# Patient Record
Sex: Male | Born: 1970 | Race: White | Hispanic: No | Smoking: Current every day smoker
Health system: Southern US, Community
[De-identification: ages and names within clinical notes are randomized; demographics above are authoritative.]

## PROBLEM LIST (undated history)

## (undated) DIAGNOSIS — Z720 Tobacco use: Secondary | ICD-10-CM

## (undated) DIAGNOSIS — F1090 Alcohol use, unspecified, uncomplicated: Secondary | ICD-10-CM

## (undated) DIAGNOSIS — R319 Hematuria, unspecified: Secondary | ICD-10-CM

## (undated) DIAGNOSIS — Z789 Other specified health status: Secondary | ICD-10-CM

## (undated) DIAGNOSIS — F419 Anxiety disorder, unspecified: Secondary | ICD-10-CM

## (undated) HISTORY — DX: Hematuria, unspecified: R31.9

## (undated) HISTORY — DX: Other specified health status: Z78.9

## (undated) HISTORY — DX: Tobacco use: Z72.0

## (undated) HISTORY — DX: Alcohol use, unspecified, uncomplicated: F10.90

## (undated) HISTORY — PX: SPLENECTOMY, TOTAL: SHX788

## (undated) HISTORY — DX: Anxiety disorder, unspecified: F41.9

---

## 1998-05-19 ENCOUNTER — Emergency Department (HOSPITAL_COMMUNITY): Admission: EM | Admit: 1998-05-19 | Discharge: 1998-05-19 | Payer: Self-pay | Admitting: Emergency Medicine

## 1998-05-19 ENCOUNTER — Encounter: Payer: Self-pay | Admitting: Emergency Medicine

## 1998-06-04 ENCOUNTER — Emergency Department (HOSPITAL_COMMUNITY): Admission: EM | Admit: 1998-06-04 | Discharge: 1998-06-04 | Payer: Self-pay | Admitting: Emergency Medicine

## 1998-10-03 ENCOUNTER — Emergency Department (HOSPITAL_COMMUNITY): Admission: EM | Admit: 1998-10-03 | Discharge: 1998-10-03 | Payer: Self-pay | Admitting: Emergency Medicine

## 1999-01-11 ENCOUNTER — Emergency Department (HOSPITAL_COMMUNITY): Admission: EM | Admit: 1999-01-11 | Discharge: 1999-01-11 | Payer: Self-pay | Admitting: Emergency Medicine

## 1999-01-11 ENCOUNTER — Encounter: Payer: Self-pay | Admitting: Emergency Medicine

## 2000-12-15 ENCOUNTER — Emergency Department (HOSPITAL_COMMUNITY): Admission: EM | Admit: 2000-12-15 | Discharge: 2000-12-15 | Payer: Self-pay | Admitting: Emergency Medicine

## 2004-11-14 ENCOUNTER — Emergency Department (HOSPITAL_COMMUNITY): Admission: EM | Admit: 2004-11-14 | Discharge: 2004-11-14 | Payer: Self-pay | Admitting: Emergency Medicine

## 2005-05-12 ENCOUNTER — Emergency Department (HOSPITAL_COMMUNITY): Admission: EM | Admit: 2005-05-12 | Discharge: 2005-05-13 | Payer: Self-pay | Admitting: Emergency Medicine

## 2005-05-20 ENCOUNTER — Emergency Department (HOSPITAL_COMMUNITY): Admission: EM | Admit: 2005-05-20 | Discharge: 2005-05-20 | Payer: Self-pay | Admitting: Family Medicine

## 2006-10-08 ENCOUNTER — Emergency Department: Payer: Self-pay | Admitting: Emergency Medicine

## 2006-11-16 ENCOUNTER — Emergency Department: Payer: Self-pay | Admitting: Emergency Medicine

## 2008-12-19 ENCOUNTER — Emergency Department: Payer: Self-pay | Admitting: Emergency Medicine

## 2009-12-17 ENCOUNTER — Emergency Department: Payer: Self-pay | Admitting: Unknown Physician Specialty

## 2010-03-14 ENCOUNTER — Emergency Department: Payer: Self-pay | Admitting: Emergency Medicine

## 2010-03-15 ENCOUNTER — Emergency Department: Payer: Self-pay | Admitting: Emergency Medicine

## 2010-11-29 ENCOUNTER — Emergency Department (HOSPITAL_COMMUNITY)
Admission: EM | Admit: 2010-11-29 | Discharge: 2010-11-29 | Disposition: A | Discharge status: Home / Self Care | Payer: Self-pay | Attending: Emergency Medicine | Admitting: Emergency Medicine

## 2010-11-29 DIAGNOSIS — X500XXA Overexertion from strenuous movement or load, initial encounter: Secondary | ICD-10-CM | POA: Insufficient documentation

## 2010-11-29 DIAGNOSIS — Y92009 Unspecified place in unspecified non-institutional (private) residence as the place of occurrence of the external cause: Secondary | ICD-10-CM | POA: Insufficient documentation

## 2010-11-29 DIAGNOSIS — S335XXA Sprain of ligaments of lumbar spine, initial encounter: Secondary | ICD-10-CM | POA: Insufficient documentation

## 2011-11-28 ENCOUNTER — Emergency Department (HOSPITAL_COMMUNITY)
Admission: EM | Admit: 2011-11-28 | Discharge: 2011-11-28 | Disposition: A | Discharge status: Home / Self Care | Payer: Self-pay | Attending: Emergency Medicine | Admitting: Emergency Medicine

## 2011-11-28 ENCOUNTER — Encounter (HOSPITAL_COMMUNITY): Payer: Self-pay | Admitting: *Deleted

## 2011-11-28 DIAGNOSIS — R51 Headache: Secondary | ICD-10-CM | POA: Insufficient documentation

## 2011-11-28 DIAGNOSIS — S0180XA Unspecified open wound of other part of head, initial encounter: Secondary | ICD-10-CM | POA: Insufficient documentation

## 2011-11-28 DIAGNOSIS — S0181XA Laceration without foreign body of other part of head, initial encounter: Secondary | ICD-10-CM

## 2011-11-28 DIAGNOSIS — W2209XA Striking against other stationary object, initial encounter: Secondary | ICD-10-CM | POA: Insufficient documentation

## 2011-11-28 MED ORDER — TETANUS-DIPHTH-ACELL PERTUSSIS 5-2.5-18.5 LF-MCG/0.5 IM SUSP
0.5000 mL | Freq: Once | INTRAMUSCULAR | Status: AC
  Administered 2011-11-28: 0.5 mL via INTRAMUSCULAR
  Filled 2011-11-28: qty 0.5

## 2011-11-28 MED ORDER — ACETAMINOPHEN 325 MG PO TABS
650.0000 mg | ORAL_TABLET | Freq: Once | ORAL | Status: AC
  Administered 2011-11-28: 650 mg via ORAL
  Filled 2011-11-28: qty 2

## 2011-11-28 MED ORDER — LIDOCAINE HCL 1 % IJ SOLN
INTRAMUSCULAR | Status: AC
  Filled 2011-11-28: qty 20

## 2011-11-28 NOTE — Discharge Instructions (Signed)

## 2011-11-28 NOTE — ED Provider Notes (Signed)
History     CSN: [id number]  Arrival date & time 11/28/11  1632   First MD Initiated Contact with Patient 11/28/11 1910      Chief Complaint  Patient presents with  . 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  Patient is a 41 y.o. male presenting with skin laceration. The history is provided by the patient. No language interpreter was used.  Laceration  The incident occurred 3 to 5 hours ago. The laceration is located on the face. The laceration is 1 cm in size. The laceration mechanism was a a metal edge. The pain is at a severity of 5/10. The pain is moderate. The pain has been constant since onset. He reports no foreign bodies present. His tetanus status is out of date.    History reviewed. No pertinent past medical history.  History reviewed. No pertinent past surgical history.  No family history on file.  History  Substance Use Topics  . Smoking status: Current Everyday Smoker -- 1.0 packs/day  . Smokeless tobacco: Not on file  . Alcohol Use: No      Review of Systems  Skin: Positive for wound.  Neurological: Positive for headaches.  All other systems reviewed and are negative.    Allergies  Review of patient's allergies indicates no known allergies.  Home Medications  No current outpatient prescriptions on file.  BP 140/93  Pulse 86  Temp(Src) 98.1 F (36.7 C) (Oral)  Resp 18  Wt 146 lb (66.225 kg)  SpO2 97%  Physical Exam  Nursing note and vitals reviewed. Constitutional: He is oriented to person, place, and time. He appears well-developed and well-nourished. No distress.  HENT:  Head: Normocephalic.  Right Ear: External ear  normal.  Left Ear: External ear normal.  Nose: Nose normal.  Mouth/Throat: Oropharynx is clear and moist. No oropharyngeal exudate.       1 cm laceration to left chin - hemostatic  Eyes: Conjunctivae are normal. Pupils are equal, round, and reactive to light.  Neck: Normal range of motion. Neck supple.  Cardiovascular: Normal rate, regular rhythm and normal heart sounds.  Exam reveals no gallop and no friction rub.   No murmur heard. Pulmonary/Chest: Effort normal and breath sounds normal. No respiratory distress. He has no wheezes. He has no rales. He exhibits no tenderness.  Abdominal: Soft. Bowel sounds are normal. He exhibits no distension. There is no tenderness.  Musculoskeletal: Normal range of motion. He exhibits no edema and no tenderness.  Lymphadenopathy:    He has no cervical adenopathy.  Neurological: He is alert and oriented to person, place, and time. No cranial nerve deficit.  Skin: Skin is warm and dry. No rash noted. No erythema. No pallor.  Psychiatric: He has a normal mood and affect. His behavior is normal. Judgment and thought content normal.    ED Course  Procedures (including critical care time)  Labs Reviewed - No data to display No results found.  LACERATION REPAIR Performed by: Patrecia Pour. Authorized by: Patrecia Pour Consent: Verbal consent obtained. Risks and benefits: risks, benefits and alternatives were discussed Consent given by: patient Patient identity confirmed: provided demographic data Prepped and Draped in normal sterile fashion Wound explored  Laceration Location: left chin  Laceration Length: 1cm  No Foreign Bodies seen or palpated  Anesthesia: local infiltration  Local anesthetic: lidocaine 1% without epinephrine  Anesthetic total: 2 ml  Irrigation method: syringe Amount of cleaning: standard  Skin closure: 4.0 prolene  Number of sutures: 2  Technique: simple interrupted  Patient tolerance: Patient tolerated  the procedure well with no immediate complications.  Chin laceration    MDM  Patient here with left chin laceration without LOC - simple laceration repair - patient tolerated well - tylenol given for headache.        Izola [place], [place] 11/28/11 2001

## 2011-11-28 NOTE — ED Provider Notes (Signed)
Medical screening examination/treatment/procedure(s) were performed by non-physician practitioner and as supervising physician I was immediately available for consultation/collaboration.  Yassen Kinnett, MD 11/28/11 2037 

## 2011-11-28 NOTE — ED Notes (Signed)
Bleeding controlled @ present
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
## 2017-07-05 ENCOUNTER — Encounter: Payer: Self-pay | Admitting: Emergency Medicine

## 2017-07-05 ENCOUNTER — Emergency Department
Admission: EM | Admit: 2017-07-05 | Discharge: 2017-07-05 | Disposition: A | Discharge status: Home / Self Care | Payer: Self-pay | Attending: Emergency Medicine | Admitting: Emergency Medicine

## 2017-07-05 ENCOUNTER — Other Ambulatory Visit: Payer: Self-pay

## 2017-07-05 ENCOUNTER — Emergency Department: Payer: Self-pay

## 2017-07-05 DIAGNOSIS — W208XXA Other cause of strike by thrown, projected or falling object, initial encounter: Secondary | ICD-10-CM | POA: Insufficient documentation

## 2017-07-05 DIAGNOSIS — M79601 Pain in right arm: Secondary | ICD-10-CM

## 2017-07-05 DIAGNOSIS — Y999 Unspecified external cause status: Secondary | ICD-10-CM | POA: Insufficient documentation

## 2017-07-05 DIAGNOSIS — Y929 Unspecified place or not applicable: Secondary | ICD-10-CM | POA: Insufficient documentation

## 2017-07-05 DIAGNOSIS — F17210 Nicotine dependence, cigarettes, uncomplicated: Secondary | ICD-10-CM | POA: Insufficient documentation

## 2017-07-05 DIAGNOSIS — Y9389 Activity, other specified: Secondary | ICD-10-CM | POA: Insufficient documentation

## 2017-07-05 DIAGNOSIS — S42464A Nondisplaced fracture of medial condyle of right humerus, initial encounter for closed fracture: Secondary | ICD-10-CM | POA: Insufficient documentation

## 2017-07-05 MED ORDER — OXYCODONE-ACETAMINOPHEN 5-325 MG PO TABS
1.0000 | ORAL_TABLET | Freq: Once | ORAL | Status: AC
  Administered 2017-07-05: 1 via ORAL
  Filled 2017-07-05: qty 1

## 2017-07-05 MED ORDER — OXYCODONE-ACETAMINOPHEN 5-325 MG PO TABS: 1.0000 | ORAL_TABLET | ORAL | 0 refills | Status: AC | PRN

## 2017-07-05 MED ORDER — MELOXICAM 15 MG PO TABS: 15.0000 mg | ORAL_TABLET | Freq: Every day | ORAL | 1 refills | Status: DC
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
## 2017-07-05 NOTE — ED Notes (Signed)
PT in XR, ICE PACK PLACED IN ROOM TO USE ON ARRIVAL BACK TO ROOM
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
## 2017-10-31 ENCOUNTER — Other Ambulatory Visit: Payer: Self-pay

## 2017-10-31 ENCOUNTER — Emergency Department
Admission: EM | Admit: 2017-10-31 | Discharge: 2017-10-31 | Disposition: A | Discharge status: Home / Self Care | Payer: No Typology Code available for payment source | Attending: Emergency Medicine | Admitting: Emergency Medicine

## 2017-10-31 ENCOUNTER — Emergency Department: Payer: No Typology Code available for payment source

## 2017-10-31 DIAGNOSIS — S42122A Displaced fracture of acromial process, left shoulder, initial encounter for closed fracture: Secondary | ICD-10-CM | POA: Diagnosis not present

## 2017-10-31 DIAGNOSIS — S4992XA Unspecified injury of left shoulder and upper arm, initial encounter: Secondary | ICD-10-CM | POA: Diagnosis present

## 2017-10-31 DIAGNOSIS — F17200 Nicotine dependence, unspecified, uncomplicated: Secondary | ICD-10-CM | POA: Diagnosis not present

## 2017-10-31 DIAGNOSIS — Y929 Unspecified place or not applicable: Secondary | ICD-10-CM | POA: Insufficient documentation

## 2017-10-31 DIAGNOSIS — Y999 Unspecified external cause status: Secondary | ICD-10-CM | POA: Insufficient documentation

## 2017-10-31 DIAGNOSIS — Y939 Activity, unspecified: Secondary | ICD-10-CM | POA: Insufficient documentation

## 2017-10-31 MED ORDER — OXYCODONE-ACETAMINOPHEN 5-325 MG PO TABS
1.0000 | ORAL_TABLET | Freq: Once | ORAL | Status: AC
  Administered 2017-10-31: 1 via ORAL
  Filled 2017-10-31: qty 1

## 2017-10-31 MED ORDER — MELOXICAM 15 MG PO TABS: 15.0000 mg | ORAL_TABLET | Freq: Every day | ORAL | 2 refills | Status: DC

## 2017-10-31 MED ORDER — OXYCODONE-ACETAMINOPHEN 5-325 MG PO TABS: 1.0000 | ORAL_TABLET | ORAL | 0 refills | Status: DC | PRN
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
## 2017-10-31 NOTE — Discharge Instructions (Signed)
Follow-up with Dr. Harlow Mares.  Call for an appointment.  Take medication as prescribed.  Apply ice.  Wear the sling for comfort.  You will need to still take your arm out and move it around so you will not get a frozen shoulder.  Return to emergency department if worsening
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
## 2018-02-22 ENCOUNTER — Encounter: Payer: Self-pay | Admitting: *Deleted

## 2018-02-22 ENCOUNTER — Emergency Department
Admission: EM | Admit: 2018-02-22 | Discharge: 2018-02-22 | Disposition: A | Discharge status: Home / Self Care | Payer: Self-pay | Attending: Emergency Medicine | Admitting: Emergency Medicine

## 2018-02-22 ENCOUNTER — Other Ambulatory Visit: Payer: Self-pay

## 2018-02-22 ENCOUNTER — Emergency Department: Payer: Self-pay

## 2018-02-22 DIAGNOSIS — Z79899 Other long term (current) drug therapy: Secondary | ICD-10-CM | POA: Insufficient documentation

## 2018-02-22 DIAGNOSIS — S60229A Contusion of unspecified hand, initial encounter: Secondary | ICD-10-CM

## 2018-02-22 DIAGNOSIS — F17210 Nicotine dependence, cigarettes, uncomplicated: Secondary | ICD-10-CM | POA: Insufficient documentation

## 2018-02-22 DIAGNOSIS — Y929 Unspecified place or not applicable: Secondary | ICD-10-CM | POA: Insufficient documentation

## 2018-02-22 DIAGNOSIS — T07XXXA Unspecified multiple injuries, initial encounter: Secondary | ICD-10-CM

## 2018-02-22 DIAGNOSIS — Y999 Unspecified external cause status: Secondary | ICD-10-CM | POA: Insufficient documentation

## 2018-02-22 DIAGNOSIS — Z23 Encounter for immunization: Secondary | ICD-10-CM | POA: Insufficient documentation

## 2018-02-22 DIAGNOSIS — S60511A Abrasion of right hand, initial encounter: Secondary | ICD-10-CM | POA: Insufficient documentation

## 2018-02-22 DIAGNOSIS — S300XXA Contusion of lower back and pelvis, initial encounter: Secondary | ICD-10-CM | POA: Insufficient documentation

## 2018-02-22 DIAGNOSIS — Y939 Activity, unspecified: Secondary | ICD-10-CM | POA: Insufficient documentation

## 2018-02-22 DIAGNOSIS — M25532 Pain in left wrist: Secondary | ICD-10-CM | POA: Insufficient documentation

## 2018-02-22 MED ORDER — OXYCODONE-ACETAMINOPHEN 5-325 MG PO TABS
1.0000 | ORAL_TABLET | Freq: Once | ORAL | Status: AC
  Administered 2018-02-22: 1 via ORAL
  Filled 2018-02-22: qty 1

## 2018-02-22 MED ORDER — IBUPROFEN 600 MG PO TABS: 600.0000 mg | ORAL_TABLET | Freq: Four times a day (QID) | ORAL | 0 refills | Status: DC | PRN

## 2018-02-22 MED ORDER — BACITRACIN ZINC 500 UNIT/GM EX OINT: TOPICAL_OINTMENT | Freq: Once | CUTANEOUS | Status: DC

## 2018-02-22 MED ORDER — TETANUS-DIPHTH-ACELL PERTUSSIS 5-2.5-18.5 LF-MCG/0.5 IM SUSP
0.5000 mL | Freq: Once | INTRAMUSCULAR | Status: AC
  Administered 2018-02-22: 0.5 mL via INTRAMUSCULAR
  Filled 2018-02-22: qty 0.5

## 2018-02-22 MED ORDER — HYDROCODONE-ACETAMINOPHEN 5-325 MG PO TABS: 1.0000 | ORAL_TABLET | Freq: Four times a day (QID) | ORAL | 0 refills | Status: AC | PRN

## 2018-02-22 NOTE — ED Notes (Signed)
Patient's abrasions were dressed with sterile gauze and wrapped with kling. Patient tolerated procedure well. Patient was given instructions to clean and medicate and dress wounds.

## 2018-02-22 NOTE — ED Notes (Signed)
Patient taken to imaging. 
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
## 2018-02-22 NOTE — Discharge Instructions (Signed)
Apply bacitracin or Neosporin ointment to the abrasions twice a day.  Return to the ER for new or worsening pain or other new or worsening symptoms that concern he.
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
## 2018-09-30 ENCOUNTER — Encounter: Payer: Self-pay | Admitting: Emergency Medicine

## 2018-09-30 ENCOUNTER — Other Ambulatory Visit: Payer: Self-pay

## 2018-09-30 ENCOUNTER — Emergency Department
Admission: EM | Admit: 2018-09-30 | Discharge: 2018-09-30 | Disposition: A | Discharge status: Home / Self Care | Payer: Self-pay | Attending: Emergency Medicine | Admitting: Emergency Medicine

## 2018-09-30 ENCOUNTER — Emergency Department: Payer: Self-pay

## 2018-09-30 DIAGNOSIS — F17210 Nicotine dependence, cigarettes, uncomplicated: Secondary | ICD-10-CM | POA: Insufficient documentation

## 2018-09-30 DIAGNOSIS — J101 Influenza due to other identified influenza virus with other respiratory manifestations: Secondary | ICD-10-CM | POA: Insufficient documentation

## 2018-09-30 DIAGNOSIS — J111 Influenza due to unidentified influenza virus with other respiratory manifestations: Secondary | ICD-10-CM

## 2018-09-30 MED ORDER — ONDANSETRON 4 MG PO TBDP
4.0000 mg | ORAL_TABLET | Freq: Once | ORAL | Status: AC
  Administered 2018-09-30: 4 mg via ORAL
  Filled 2018-09-30: qty 1

## 2018-09-30 MED ORDER — ONDANSETRON 4 MG PO TBDP: 4.0000 mg | ORAL_TABLET | Freq: Three times a day (TID) | ORAL | 0 refills | Status: DC | PRN

## 2018-09-30 NOTE — Discharge Instructions (Signed)
Follow-up with your regular doctor if not better in 3 days.  Return emergency department worsening.  Drink plenty of fluids.  Use the Zofran ODT for nausea as needed.

## 2018-09-30 NOTE — ED Provider Notes (Signed)
[facility] Emergency Department Provider Note  ____________________________________________   First MD Initiated Contact with Patient 09/30/18 0809     (approximate)  I have reviewed the triage vital signs and the nursing notes.   HISTORY  Chief Complaint Fever and Sore Throat    HPI Paul Gregory is a 48 y.o. male since emergency department with flulike symptoms, patient is complained of fever, chills, body aches.  cough, sore throat, denies vomiting, denies diarrhea; denies chest pain or sob.  Sx for 2 days, patient is afraid he has pneumonia   History reviewed. No pertinent past medical history.  There are no active problems to display for this patient.   History reviewed. No pertinent surgical history.  Prior to Admission medications   Medication Sig Start Date End Date Taking? Authorizing Provider  ondansetron (ZOFRAN-ODT) 4 MG disintegrating tablet Take 1 tablet (4 mg total) by mouth every 8 (eight) hours as needed. 09/30/18   Versie Starks, PA-C    Allergies Patient has no known allergies.  No family history on file.  Social History Social History   Tobacco Use  . Smoking status: Current Every Day Smoker    Packs/day: 1.00  . Smokeless tobacco: Never Used  Substance Use Topics  . Alcohol use: Yes    Comment: weekends  . Drug use: No    Review of Systems  Constitutional: Positive fever/chills Eyes: No visual changes. ENT: Positive sore throat. Respiratory: Positive cough Genitourinary: Negative for dysuria. Musculoskeletal: Negative for back pain. Skin: Negative for rash.    ____________________________________________   PHYSICAL EXAM:  VITAL SIGNS: ED Triage Vitals  Enc Vitals Group     BP 09/30/18 0807 132/81     Pulse Rate 09/30/18 0807 (!) 123     Resp 09/30/18 0807 18     Temp 09/30/18 0807 99.8 F (37.7 C)     Temp Source 09/30/18 0807 Oral     SpO2 09/30/18 0807 96 %     Weight 09/30/18 0804 165 lb  (74.8 kg)     Height 09/30/18 0804 5\' 7"  (1.702 m)     Head Circumference --      Peak Flow --      Pain Score 09/30/18 0804 10     Pain Loc --      Pain Edu? --      Excl. in [place]? --     Constitutional: Alert and oriented. Well appearing and in no acute distress. Eyes: Conjunctivae are normal.  Head: Atraumatic. Nose: No congestion/rhinnorhea. Mouth/Throat: Mucous membranes are moist.  Throat appears normal Neck:  supple no lymphadenopathy noted Cardiovascular: Normal rate, regular rhythm. Heart sounds are normal Respiratory: Normal respiratory effort.  No retractions, lungs c t a  GU: deferred Musculoskeletal: FROM all extremities, warm and well perfused Neurologic:  Normal speech and language.  Skin:  Skin is warm, dry and intact. No rash noted. Psychiatric: Mood and affect are normal. Speech and behavior are normal.  ____________________________________________   LABS (all labs ordered are listed, but only abnormal results are displayed)  Labs Reviewed - No data to display ____________________________________________   ____________________________________________  RADIOLOGY  Chest x-ray is negative for pneumonia  ____________________________________________   PROCEDURES  Procedure(s) performed: No  Procedures    ____________________________________________   INITIAL IMPRESSION / ASSESSMENT AND PLAN / ED COURSE  Pertinent labs & imaging results that were available during my care of the patient were reviewed by me and considered in my medical decision making (  see chart for details).   Patient is 48 year old male presents emergency department flulike symptoms.  He states he is also afraid he has pneumonia.  Physical exam shows a nontoxic male.  Chest x-ray is negative  Explained all the findings to the patient.  He was given a prescription for Zofran due to the nausea.  He is to drink plenty of fluids.  Take Tylenol and ibuprofen for pain as needed.   Return emergency department worsening.  See a regular doctor and have a repeat chest x-ray about 6 weeks due to the questionable pulmonary nodule versus nipple shadow.  He states he understands will comply.  Was discharged stable condition.     As part of my medical decision making, I reviewed the following data within the [place] History obtained from family, Nursing notes reviewed and incorporated, Old chart reviewed, Radiograph reviewed chest x-ray is negative, Notes from prior ED visits and [place] Controlled Substance Database  ____________________________________________   FINAL CLINICAL IMPRESSION(S) / ED DIAGNOSES  Final diagnoses:  Influenza      NEW MEDICATIONS STARTED DURING THIS VISIT:  Discharge Medication List as of 09/30/2018  9:04 AM    START taking these medications   Details  ondansetron (ZOFRAN-ODT) 4 MG disintegrating tablet Take 1 tablet (4 mg total) by mouth every 8 (eight) hours as needed., Starting Tue 09/30/2018, Normal         Note:  This document was prepared using Dragon voice recognition software and may include unintentional dictation errors.    Versie Starks, PA-C 09/30/18 7782    Eula Listen, MD 09/30/18 1200

## 2018-09-30 NOTE — ED Notes (Signed)
See triage note  Presents with sore throat and fever for the past couple of days   Low grade fever on arrival  Occasional cough  Non productive

## 2018-09-30 NOTE — ED Triage Notes (Signed)
Pt states fever the last few days, sore throat, cough causing his back to ache, NAD at this time.

## 2019-08-04 ENCOUNTER — Inpatient Hospital Stay (HOSPITAL_COMMUNITY)
Admission: EM | Admit: 2019-08-04 | Discharge: 2019-08-09 | DRG: 957 | Disposition: A | Discharge status: Home / Self Care | Payer: Medicaid Other | Attending: General Surgery | Admitting: General Surgery

## 2019-08-04 ENCOUNTER — Other Ambulatory Visit: Payer: Self-pay

## 2019-08-04 ENCOUNTER — Inpatient Hospital Stay (HOSPITAL_COMMUNITY): Payer: Medicaid Other

## 2019-08-04 ENCOUNTER — Encounter (HOSPITAL_COMMUNITY): Payer: Self-pay | Admitting: Emergency Medicine

## 2019-08-04 ENCOUNTER — Emergency Department (HOSPITAL_COMMUNITY): Payer: Medicaid Other

## 2019-08-04 ENCOUNTER — Emergency Department (HOSPITAL_COMMUNITY): Payer: Medicaid Other | Admitting: Anesthesiology

## 2019-08-04 ENCOUNTER — Encounter (HOSPITAL_COMMUNITY): Admission: EM | Disposition: A | Discharge status: Home / Self Care | Payer: Self-pay | Source: Home / Self Care

## 2019-08-04 DIAGNOSIS — Z23 Encounter for immunization: Secondary | ICD-10-CM | POA: Diagnosis not present

## 2019-08-04 DIAGNOSIS — Z20828 Contact with and (suspected) exposure to other viral communicable diseases: Secondary | ICD-10-CM | POA: Diagnosis present

## 2019-08-04 DIAGNOSIS — C49A3 Gastrointestinal stromal tumor of small intestine: Secondary | ICD-10-CM | POA: Diagnosis not present

## 2019-08-04 DIAGNOSIS — S46002A Unspecified injury of muscle(s) and tendon(s) of the rotator cuff of left shoulder, initial encounter: Secondary | ICD-10-CM | POA: Diagnosis present

## 2019-08-04 DIAGNOSIS — I10 Essential (primary) hypertension: Secondary | ICD-10-CM | POA: Diagnosis present

## 2019-08-04 DIAGNOSIS — S36039A Unspecified laceration of spleen, initial encounter: Secondary | ICD-10-CM | POA: Diagnosis present

## 2019-08-04 DIAGNOSIS — F1010 Alcohol abuse, uncomplicated: Secondary | ICD-10-CM | POA: Diagnosis present

## 2019-08-04 DIAGNOSIS — R578 Other shock: Secondary | ICD-10-CM | POA: Diagnosis present

## 2019-08-04 DIAGNOSIS — K661 Hemoperitoneum: Secondary | ICD-10-CM | POA: Diagnosis present

## 2019-08-04 DIAGNOSIS — D62 Acute posthemorrhagic anemia: Secondary | ICD-10-CM | POA: Diagnosis present

## 2019-08-04 DIAGNOSIS — S82461B Displaced segmental fracture of shaft of right fibula, initial encounter for open fracture type I or II: Principal | ICD-10-CM | POA: Diagnosis present

## 2019-08-04 DIAGNOSIS — D696 Thrombocytopenia, unspecified: Secondary | ICD-10-CM | POA: Diagnosis present

## 2019-08-04 DIAGNOSIS — S51811A Laceration without foreign body of right forearm, initial encounter: Secondary | ICD-10-CM | POA: Diagnosis present

## 2019-08-04 DIAGNOSIS — S0081XA Abrasion of other part of head, initial encounter: Secondary | ICD-10-CM | POA: Diagnosis present

## 2019-08-04 DIAGNOSIS — F17210 Nicotine dependence, cigarettes, uncomplicated: Secondary | ICD-10-CM | POA: Diagnosis present

## 2019-08-04 DIAGNOSIS — Z419 Encounter for procedure for purposes other than remedying health state, unspecified: Secondary | ICD-10-CM

## 2019-08-04 DIAGNOSIS — T148XXA Other injury of unspecified body region, initial encounter: Secondary | ICD-10-CM

## 2019-08-04 DIAGNOSIS — S82261B Displaced segmental fracture of shaft of right tibia, initial encounter for open fracture type I or II: Secondary | ICD-10-CM

## 2019-08-04 DIAGNOSIS — Z9889 Other specified postprocedural states: Secondary | ICD-10-CM

## 2019-08-04 DIAGNOSIS — Z978 Presence of other specified devices: Secondary | ICD-10-CM

## 2019-08-04 DIAGNOSIS — S82401B Unspecified fracture of shaft of right fibula, initial encounter for open fracture type I or II: Secondary | ICD-10-CM | POA: Diagnosis present

## 2019-08-04 DIAGNOSIS — S82202B Unspecified fracture of shaft of left tibia, initial encounter for open fracture type I or II: Secondary | ICD-10-CM

## 2019-08-04 DIAGNOSIS — T1490XA Injury, unspecified, initial encounter: Secondary | ICD-10-CM

## 2019-08-04 DIAGNOSIS — K567 Ileus, unspecified: Secondary | ICD-10-CM | POA: Diagnosis not present

## 2019-08-04 DIAGNOSIS — Z4659 Encounter for fitting and adjustment of other gastrointestinal appliance and device: Secondary | ICD-10-CM

## 2019-08-04 HISTORY — PX: LAPAROTOMY: SHX154

## 2019-08-04 LAB — CBC
HCT: 44.4 % (ref 39.0–52.0)
Hemoglobin: 15.2 g/dL (ref 13.0–17.0)
MCH: 34.5 pg — ABNORMAL HIGH (ref 26.0–34.0)
MCHC: 34.2 g/dL (ref 30.0–36.0)
MCV: 100.7 fL — ABNORMAL HIGH (ref 80.0–100.0)
Platelets: 195 10*3/uL (ref 150–400)
RBC: 4.41 MIL/uL (ref 4.22–5.81)
RDW: 11.9 % (ref 11.5–15.5)
WBC: 9.7 10*3/uL (ref 4.0–10.5)
nRBC: 0 % (ref 0.0–0.2)

## 2019-08-04 LAB — I-STAT CHEM 8, ED
BUN: 11 mg/dL (ref 6–20)
Calcium, Ion: 1 mmol/L — ABNORMAL LOW (ref 1.15–1.40)
Chloride: 106 mmol/L (ref 98–111)
Creatinine, Ser: 1.2 mg/dL (ref 0.61–1.24)
Glucose, Bld: 127 mg/dL — ABNORMAL HIGH (ref 70–99)
HCT: 43 % (ref 39.0–52.0)
Hemoglobin: 14.6 g/dL (ref 13.0–17.0)
Potassium: 3 mmol/L — ABNORMAL LOW (ref 3.5–5.1)
Sodium: 138 mmol/L (ref 135–145)
TCO2: 20 mmol/L — ABNORMAL LOW (ref 22–32)

## 2019-08-04 LAB — COMPREHENSIVE METABOLIC PANEL
ALT: 43 U/L (ref 0–44)
AST: 47 U/L — ABNORMAL HIGH (ref 15–41)
Albumin: 3.8 g/dL (ref 3.5–5.0)
Alkaline Phosphatase: 43 U/L (ref 38–126)
Anion gap: 12 (ref 5–15)
BUN: 11 mg/dL (ref 6–20)
CO2: 21 mmol/L — ABNORMAL LOW (ref 22–32)
Calcium: 8.7 mg/dL — ABNORMAL LOW (ref 8.9–10.3)
Chloride: 106 mmol/L (ref 98–111)
Creatinine, Ser: 1.09 mg/dL (ref 0.61–1.24)
GFR calc Af Amer: >60 mL/min (ref >60)
GFR calc non Af Amer: >60 mL/min (ref >60)
Glucose, Bld: 135 mg/dL — ABNORMAL HIGH (ref 70–99)
Potassium: 3.1 mmol/L — ABNORMAL LOW (ref 3.5–5.1)
Sodium: 139 mmol/L (ref 135–145)
Total Bilirubin: 0.2 mg/dL — ABNORMAL LOW (ref 0.3–1.2)
Total Protein: 6 g/dL — ABNORMAL LOW (ref 6.5–8.1)

## 2019-08-04 LAB — PROTIME-INR
INR: 0.9 (ref 0.8–1.2)
INR: 1.1 (ref 0.8–1.2)
Prothrombin Time: 12.5 seconds (ref 11.4–15.2)
Prothrombin Time: 14.4 seconds (ref 11.4–15.2)

## 2019-08-04 LAB — LACTIC ACID, PLASMA
Lactic Acid, Venous: 3.1 mmol/L (ref 0.5–1.9)
Lactic Acid, Venous: 2.1 mmol/L (ref 0.5–1.9)

## 2019-08-04 LAB — RESPIRATORY PANEL BY RT PCR (FLU A&B, COVID)
Influenza A by PCR: NEGATIVE
Influenza B by PCR: NEGATIVE
SARS Coronavirus 2 by RT PCR: NEGATIVE

## 2019-08-04 LAB — MRSA PCR SCREENING: MRSA by PCR: NEGATIVE

## 2019-08-04 LAB — APTT: aPTT: 22 seconds — ABNORMAL LOW (ref 24–36)

## 2019-08-04 LAB — ABO/RH: ABO/RH(D): O NEG

## 2019-08-04 LAB — ETHANOL: Alcohol, Ethyl (B): 287 mg/dL — ABNORMAL HIGH (ref <10)

## 2019-08-04 LAB — CDS SEROLOGY

## 2019-08-04 LAB — SAMPLE TO BLOOD BANK

## 2019-08-04 SURGERY — LAPAROTOMY, EXPLORATORY
Anesthesia: General | Site: Abdomen

## 2019-08-04 MED ORDER — SODIUM BICARBONATE 8.4 % IV SOLN
INTRAVENOUS | Status: DC | PRN
  Administered 2019-08-04: 50 meq via INTRAVENOUS

## 2019-08-04 MED ORDER — DEXAMETHASONE SODIUM PHOSPHATE 10 MG/ML IJ SOLN
INTRAMUSCULAR | Status: DC | PRN
  Administered 2019-08-04: 10 mg via INTRAVENOUS

## 2019-08-04 MED ORDER — HYDROMORPHONE HCL 1 MG/ML IJ SOLN
INTRAMUSCULAR | Status: AC
  Administered 2019-08-04: 0.5 mg via INTRAVENOUS
  Filled 2019-08-04: qty 1

## 2019-08-04 MED ORDER — ONDANSETRON HCL 4 MG/2ML IJ SOLN
INTRAMUSCULAR | Status: AC
  Filled 2019-08-04: qty 2

## 2019-08-04 MED ORDER — DOCUSATE SODIUM 100 MG PO CAPS
100.0000 mg | ORAL_CAPSULE | Freq: Two times a day (BID) | ORAL | Status: DC
  Administered 2019-08-06 – 2019-08-08 (×7): 100 mg via ORAL
  Filled 2019-08-04 (×7): qty 1

## 2019-08-04 MED ORDER — SODIUM CHLORIDE 0.9 % IV SOLN: INTRAVENOUS | Status: DC

## 2019-08-04 MED ORDER — 0.9 % SODIUM CHLORIDE (POUR BTL) OPTIME
TOPICAL | Status: DC | PRN
  Administered 2019-08-04 (×2): 2000 mL

## 2019-08-04 MED ORDER — LACTATED RINGERS IV SOLN: INTRAVENOUS | Status: DC | PRN

## 2019-08-04 MED ORDER — SODIUM CHLORIDE 0.9 % IV SOLN: INTRAVENOUS | Status: DC | PRN

## 2019-08-04 MED ORDER — EPHEDRINE 5 MG/ML INJ
INTRAVENOUS | Status: AC
  Filled 2019-08-04: qty 10

## 2019-08-04 MED ORDER — METHOCARBAMOL 1000 MG/10ML IJ SOLN
500.0000 mg | Freq: Four times a day (QID) | INTRAVENOUS | Status: DC | PRN
  Administered 2019-08-05: 500 mg via INTRAVENOUS
  Filled 2019-08-04 (×3): qty 5

## 2019-08-04 MED ORDER — LIDOCAINE HCL (CARDIAC) PF 100 MG/5ML IV SOSY
PREFILLED_SYRINGE | INTRAVENOUS | Status: DC | PRN
  Administered 2019-08-04: 80 mg via INTRATRACHEAL

## 2019-08-04 MED ORDER — SUCCINYLCHOLINE 20MG/ML (10ML) SYRINGE FOR MEDFUSION PUMP - OPTIME
INTRAMUSCULAR | Status: DC | PRN
  Administered 2019-08-04: 100 mg via INTRAVENOUS

## 2019-08-04 MED ORDER — BISACODYL 10 MG RE SUPP: 10.0000 mg | Freq: Every day | RECTAL | Status: DC | PRN

## 2019-08-04 MED ORDER — HYDROMORPHONE HCL 1 MG/ML IJ SOLN
1.0000 mg | Freq: Once | INTRAMUSCULAR | Status: AC
  Administered 2019-08-04: 1 mg via INTRAVENOUS

## 2019-08-04 MED ORDER — LIDOCAINE 2% (20 MG/ML) 5 ML SYRINGE
INTRAMUSCULAR | Status: AC
  Filled 2019-08-04: qty 10

## 2019-08-04 MED ORDER — DEXAMETHASONE SODIUM PHOSPHATE 10 MG/ML IJ SOLN
INTRAMUSCULAR | Status: AC
  Filled 2019-08-04: qty 1

## 2019-08-04 MED ORDER — PANTOPRAZOLE SODIUM 40 MG PO TBEC
40.0000 mg | DELAYED_RELEASE_TABLET | Freq: Every day | ORAL | Status: DC
  Administered 2019-08-04: 40 mg via ORAL
  Filled 2019-08-04: qty 1

## 2019-08-04 MED ORDER — HYDROMORPHONE HCL 1 MG/ML IJ SOLN
INTRAMUSCULAR | Status: AC
  Filled 2019-08-04: qty 1

## 2019-08-04 MED ORDER — GABAPENTIN 300 MG PO CAPS
300.0000 mg | ORAL_CAPSULE | Freq: Three times a day (TID) | ORAL | Status: DC
  Administered 2019-08-04 – 2019-08-08 (×12): 300 mg via ORAL
  Filled 2019-08-04 (×12): qty 1

## 2019-08-04 MED ORDER — PROPOFOL 10 MG/ML IV BOLUS
INTRAVENOUS | Status: DC | PRN
  Administered 2019-08-04: 100 ug via INTRAVENOUS

## 2019-08-04 MED ORDER — ROCURONIUM BROMIDE 10 MG/ML (PF) SYRINGE
PREFILLED_SYRINGE | INTRAVENOUS | Status: AC
  Filled 2019-08-04: qty 10

## 2019-08-04 MED ORDER — HYDROMORPHONE HCL 1 MG/ML IJ SOLN
0.2500 mg | INTRAMUSCULAR | Status: DC | PRN
  Administered 2019-08-04: 0.5 mg via INTRAVENOUS

## 2019-08-04 MED ORDER — HYDRALAZINE HCL 20 MG/ML IJ SOLN: 10.0000 mg | INTRAMUSCULAR | Status: DC | PRN

## 2019-08-04 MED ORDER — ROCURONIUM 10MG/ML (10ML) SYRINGE FOR MEDFUSION PUMP - OPTIME
INTRAVENOUS | Status: DC | PRN
  Administered 2019-08-04: 50 mg via INTRAVENOUS

## 2019-08-04 MED ORDER — SUGAMMADEX SODIUM 200 MG/2ML IV SOLN
INTRAVENOUS | Status: DC | PRN
  Administered 2019-08-04: 200 mg via INTRAVENOUS

## 2019-08-04 MED ORDER — FENTANYL CITRATE (PF) 250 MCG/5ML IJ SOLN
INTRAMUSCULAR | Status: AC
  Filled 2019-08-04: qty 5

## 2019-08-04 MED ORDER — ONDANSETRON HCL 4 MG/2ML IJ SOLN
INTRAMUSCULAR | Status: DC | PRN
  Administered 2019-08-04: 4 mg via INTRAVENOUS

## 2019-08-04 MED ORDER — ONDANSETRON 4 MG PO TBDP: 4.0000 mg | ORAL_TABLET | Freq: Four times a day (QID) | ORAL | Status: DC | PRN

## 2019-08-04 MED ORDER — FENTANYL CITRATE (PF) 250 MCG/5ML IJ SOLN
INTRAMUSCULAR | Status: DC | PRN
  Administered 2019-08-04: 100 ug via INTRAVENOUS
  Administered 2019-08-04 (×3): 50 ug via INTRAVENOUS

## 2019-08-04 MED ORDER — SUCCINYLCHOLINE CHLORIDE 200 MG/10ML IV SOSY
PREFILLED_SYRINGE | INTRAVENOUS | Status: AC
  Filled 2019-08-04: qty 10

## 2019-08-04 MED ORDER — HYDROMORPHONE HCL 1 MG/ML IJ SOLN
INTRAMUSCULAR | Status: AC | PRN
  Administered 2019-08-04: 1 mg via INTRAVENOUS

## 2019-08-04 MED ORDER — HYDROMORPHONE HCL 1 MG/ML IJ SOLN
0.5000 mg | INTRAMUSCULAR | Status: DC | PRN
  Administered 2019-08-04 – 2019-08-05 (×5): 1 mg via INTRAVENOUS
  Filled 2019-08-04 (×5): qty 1

## 2019-08-04 MED ORDER — CHLORHEXIDINE GLUCONATE CLOTH 2 % EX PADS
6.0000 | MEDICATED_PAD | Freq: Every day | CUTANEOUS | Status: DC
  Administered 2019-08-04: 6 via TOPICAL

## 2019-08-04 MED ORDER — ONDANSETRON HCL 4 MG/2ML IJ SOLN
4.0000 mg | Freq: Four times a day (QID) | INTRAMUSCULAR | Status: DC | PRN
  Administered 2019-08-05: 4 mg via INTRAVENOUS

## 2019-08-04 MED ORDER — CEFAZOLIN SODIUM-DEXTROSE 2-4 GM/100ML-% IV SOLN
2.0000 g | Freq: Once | INTRAVENOUS | Status: AC
  Administered 2019-08-04: 2 g via INTRAVENOUS
  Filled 2019-08-04: qty 100

## 2019-08-04 MED ORDER — ACETAMINOPHEN 325 MG PO TABS
650.0000 mg | ORAL_TABLET | Freq: Four times a day (QID) | ORAL | Status: DC
  Administered 2019-08-04 – 2019-08-09 (×16): 650 mg via ORAL
  Filled 2019-08-04 (×17): qty 2

## 2019-08-04 MED ORDER — OXYCODONE HCL 5 MG PO TABS
5.0000 mg | ORAL_TABLET | ORAL | Status: DC | PRN
  Administered 2019-08-04: 5 mg via ORAL
  Filled 2019-08-04: qty 1

## 2019-08-04 MED ORDER — IOHEXOL 300 MG/ML  SOLN
100.0000 mL | Freq: Once | INTRAMUSCULAR | Status: AC | PRN
  Administered 2019-08-04: 100 mL via INTRAVENOUS

## 2019-08-04 MED ORDER — PROPOFOL 10 MG/ML IV BOLUS
INTRAVENOUS | Status: AC
  Filled 2019-08-04: qty 20

## 2019-08-04 MED ORDER — PANTOPRAZOLE SODIUM 40 MG IV SOLR: 40.0000 mg | Freq: Every day | INTRAVENOUS | Status: DC

## 2019-08-04 MED ORDER — CEFAZOLIN SODIUM-DEXTROSE 2-4 GM/100ML-% IV SOLN
2.0000 g | Freq: Three times a day (TID) | INTRAVENOUS | Status: DC
  Administered 2019-08-04 – 2019-08-05 (×2): 2 g via INTRAVENOUS
  Filled 2019-08-04 (×4): qty 100

## 2019-08-04 MED ORDER — METOPROLOL TARTRATE 5 MG/5ML IV SOLN
5.0000 mg | Freq: Four times a day (QID) | INTRAVENOUS | Status: DC | PRN
  Administered 2019-08-04 – 2019-08-05 (×2): 5 mg via INTRAVENOUS
  Filled 2019-08-04 (×2): qty 5

## 2019-08-04 SURGICAL SUPPLY — 43 items
APL PRP STRL LF DISP 70% ISPRP (MISCELLANEOUS) ×1
BLADE CLIPPER SURG (BLADE) ×2 IMPLANT
CANISTER SUCT 3000ML PPV (MISCELLANEOUS) ×3 IMPLANT
CHLORAPREP W/TINT 26 (MISCELLANEOUS) ×3 IMPLANT
COVER SURGICAL LIGHT HANDLE (MISCELLANEOUS) ×3 IMPLANT
COVER WAND RF STERILE (DRAPES) ×3 IMPLANT
DRAPE LAPAROSCOPIC ABDOMINAL (DRAPES) ×3 IMPLANT
DRAPE WARM FLUID 44X44 (DRAPES) ×3 IMPLANT
DRSG OPSITE POSTOP 4X10 (GAUZE/BANDAGES/DRESSINGS) ×2 IMPLANT
DRSG OPSITE POSTOP 4X8 (GAUZE/BANDAGES/DRESSINGS) IMPLANT
ELECT BLADE 6.5 EXT (BLADE) ×2 IMPLANT
ELECT CAUTERY BLADE 6.4 (BLADE) ×3 IMPLANT
ELECT REM PT RETURN 9FT ADLT (ELECTROSURGICAL) ×3
ELECTRODE REM PT RTRN 9FT ADLT (ELECTROSURGICAL) ×1 IMPLANT
GLOVE BIO SURGEON STRL SZ 6 (GLOVE) ×3 IMPLANT
GLOVE INDICATOR 6.5 STRL GRN (GLOVE) ×3 IMPLANT
GOWN STRL REUS W/ TWL LRG LVL3 (GOWN DISPOSABLE) ×2 IMPLANT
GOWN STRL REUS W/TWL LRG LVL3 (GOWN DISPOSABLE) ×6
HANDLE SUCTION POOLE (INSTRUMENTS) ×1 IMPLANT
HEMOSTAT ARISTA ABSORB 3G PWDR (HEMOSTASIS) ×2 IMPLANT
KIT BASIN OR (CUSTOM PROCEDURE TRAY) ×3 IMPLANT
KIT TURNOVER KIT B (KITS) ×3 IMPLANT
LIGASURE IMPACT 36 18CM CVD LR (INSTRUMENTS) IMPLANT
NS IRRIG 1000ML POUR BTL (IV SOLUTION) ×6 IMPLANT
PACK GENERAL/GYN (CUSTOM PROCEDURE TRAY) ×3 IMPLANT
PAD ARMBOARD 7.5X6 YLW CONV (MISCELLANEOUS) ×3 IMPLANT
PENCIL SMOKE EVACUATOR (MISCELLANEOUS) ×3 IMPLANT
RELOAD PROXIMATE 75MM BLUE (ENDOMECHANICALS) ×6 IMPLANT
RELOAD STAPLE 75 3.8 BLU REG (ENDOMECHANICALS) IMPLANT
SPECIMEN JAR LARGE (MISCELLANEOUS) IMPLANT
SPONGE LAP 18X18 RF (DISPOSABLE) IMPLANT
STAPLER GUN LINEAR PROX 60 (STAPLE) ×2 IMPLANT
STAPLER PROXIMATE 75MM BLUE (STAPLE) ×2 IMPLANT
STAPLER VISISTAT 35W (STAPLE) ×5 IMPLANT
SUCTION POOLE HANDLE (INSTRUMENTS) ×3
SUT PDS AB 1 TP1 96 (SUTURE) ×6 IMPLANT
SUT SILK 2 0 SH CR/8 (SUTURE) ×3 IMPLANT
SUT SILK 2 0 TIES 10X30 (SUTURE) ×3 IMPLANT
SUT SILK 3 0 SH CR/8 (SUTURE) ×3 IMPLANT
SUT SILK 3 0 TIES 10X30 (SUTURE) ×3 IMPLANT
SUT VIC AB 3-0 SH 18 (SUTURE) IMPLANT
TOWEL GREEN STERILE (TOWEL DISPOSABLE) ×3 IMPLANT
TRAY FOLEY MTR SLVR 16FR STAT (SET/KITS/TRAYS/PACK) ×2 IMPLANT

## 2019-08-04 NOTE — ED Notes (Signed)
Dr. Stann Mainland paged [phone] Dr. Tomi Bamberger paged by Levada Dy
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
## 2019-08-04 NOTE — ED Notes (Signed)
2 UNITS PRBC'S GIVEN EMERGENCY RELEASE
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
## 2019-08-04 NOTE — Anesthesia Procedure Notes (Signed)
Arterial Line Insertion Start/End12/15/2020 7:30 PM, 08/04/2019 7:30 PM Performed by: Claris Che, CRNA, CRNA  Patient location: OR. Preanesthetic checklist: patient identified, monitors and equipment checked and pre-op evaluation Patient sedated Left, radial was placed Catheter size: 20 G Hand hygiene performed  and maximum sterile barriers used   Attempts: 1 Procedure performed without using ultrasound guided technique. Following insertion, dressing applied and Biopatch. Post procedure assessment: normal  Patient tolerated the procedure well with no immediate complications.

## 2019-08-04 NOTE — Progress Notes (Signed)
Chaplain went to find Mr. Chadick girlfriend, Barbie Banner, who he noted was at the hospital and whom he wanted [place] to call. Chaplain could not find girlfriend in ED waiting area or obtain number at this time.  Nurse will see if Mr. Parmentier can call her from his phone or get number from him.  Chaplain will follow-up as needed.

## 2019-08-04 NOTE — ED Notes (Signed)
Trauma End 1908

## 2019-08-04 NOTE — ED Notes (Signed)
Patient taken to OR emergently with Primary RN and Dr Kae Heller.  Report given to [place].  All belongings with patient to OR.

## 2019-08-04 NOTE — ED Notes (Signed)
Barbie Banner (Girlfriend#[phone]) called/would like for patient to call after his surgery.

## 2019-08-04 NOTE — Progress Notes (Signed)
Orthopedic Tech Progress Note Patient Details:  Paul Gregory 1970/12/16 [id number] I called back down here to the ED to see if patient had return from scans and once I came into the room the MD at bed side said "I could just add a suagertong to the splint I had on because patient foot kept falling over to the side and patient was on the way up to OR shortly."  Ortho Devices Type of Ortho Device: Stirrup splint Ortho Device/Splint Location: LRE Ortho Device/Splint Interventions: Other (comment)   Post Interventions Patient Tolerated: Fair Instructions Provided: Care of device, Adjustment of device   [name] 08/04/2019, 6:56 PM
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
## 2019-08-04 NOTE — Anesthesia Procedure Notes (Signed)
Procedure Name: Intubation Date/Time: 08/04/2019 7:20 PM Performed by: Valetta Fuller, CRNA Pre-anesthesia Checklist: Patient identified, Emergency Drugs available, Suction available and Patient being monitored Oxygen Delivery Method: Circle system utilized Preoxygenation: Pre-oxygenation with 100% oxygen Induction Type: IV induction, Rapid sequence and Cricoid Pressure applied Laryngoscope Size: Miller and 2 Grade View: Grade I Tube type: Oral Tube size: 7.5 mm Number of attempts: 1 Airway Equipment and Method: Stylet Placement Confirmation: ETT inserted through vocal cords under direct vision,  positive ETCO2 and breath sounds checked- equal and bilateral Secured at: 22 cm Tube secured with: Tape Dental Injury: Teeth and Oropharynx as per pre-operative assessment

## 2019-08-04 NOTE — Transfer of Care (Signed)
Immediate Anesthesia Transfer of Care Note  Patient: Paul Gregory  Procedure(s) Performed: EXPLORATORY LAPAROTOMY, splenectomy, small bowel resection (N/A Abdomen)  Patient Location: PACU  Anesthesia Type:General  Level of Consciousness: awake and drowsy  Airway & Oxygen Therapy: Patient connected to face mask oxygen  Post-op Assessment: Report given to RN and Post -op Vital signs reviewed and stable  Post vital signs: Reviewed and stable  Last Vitals:  Vitals Value Taken Time  BP    Temp    Pulse    Resp    SpO2      Last Pain:  Vitals:   08/04/19 1739  TempSrc: Oral  PainSc:          Complications: No apparent anesthesia complications
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
## 2019-08-04 NOTE — Progress Notes (Addendum)
Upon arrival to PACU, NG tube was not secured by previous staff. The NG tube appeared to have already moved out of place prior to arrival to PACU. Gerrit Friends, RN aware who made Dr. Kae Heller aware. Orders to remove/replace NG tube received by Dr. Kae Heller. NG tube placed by Clovis Pu, RN

## 2019-08-04 NOTE — Anesthesia Preprocedure Evaluation (Addendum)
Anesthesia Evaluation  Patient identified by MRN, date of birth, ID band Patient awake    Reviewed: Allergy & Precautions, H&P , NPO status , Patient's Chart, lab work & pertinent test results  Airway Mallampati: II  TM Distance: >3 FB Neck ROM: Full    Dental no notable dental hx. (+) Partial Upper, Dental Advisory Given   Pulmonary Current SmokerPatient did not abstain from smoking.,    Pulmonary exam normal breath sounds clear to auscultation       Cardiovascular negative cardio ROS   Rhythm:Regular Rate:Normal     Neuro/Psych negative neurological ROS  negative psych ROS   GI/Hepatic negative GI ROS, Neg liver ROS,   Endo/Other  negative endocrine ROS  Renal/GU negative Renal ROS  negative genitourinary   Musculoskeletal   Abdominal   Peds  Hematology negative hematology ROS (+)   Anesthesia Other Findings   Reproductive/Obstetrics negative OB ROS                            Anesthesia Physical Anesthesia Plan  ASA: II and emergent  Anesthesia Plan: General   Post-op Pain Management:    Induction: Intravenous, Rapid sequence and Cricoid pressure planned  PONV Risk Score and Plan: 2 and Ondansetron and Midazolam  Airway Management Planned: Oral ETT  Additional Equipment: Arterial line  Intra-op Plan:   Post-operative Plan: Extubation in OR  Informed Consent: I have reviewed the patients History and Physical, chart, labs and discussed the procedure including the risks, benefits and alternatives for the proposed anesthesia with the patient or authorized representative who has indicated his/her understanding and acceptance.     Dental advisory given  Plan Discussed with: CRNA  Anesthesia Plan Comments:        Anesthesia Quick Evaluation
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
## 2019-08-05 ENCOUNTER — Encounter (HOSPITAL_COMMUNITY): Admission: EM | Disposition: A | Discharge status: Home / Self Care | Payer: Self-pay | Source: Home / Self Care

## 2019-08-05 ENCOUNTER — Inpatient Hospital Stay (HOSPITAL_COMMUNITY): Payer: Medicaid Other | Admitting: Certified Registered"

## 2019-08-05 ENCOUNTER — Inpatient Hospital Stay (HOSPITAL_COMMUNITY): Payer: Medicaid Other

## 2019-08-05 DIAGNOSIS — S82261B Displaced segmental fracture of shaft of right tibia, initial encounter for open fracture type I or II: Secondary | ICD-10-CM

## 2019-08-05 HISTORY — PX: APPLICATION OF WOUND VAC: SHX5189

## 2019-08-05 HISTORY — PX: I & D EXTREMITY: SHX5045

## 2019-08-05 HISTORY — PX: TIBIA IM NAIL INSERTION: SHX2516

## 2019-08-05 LAB — COMPREHENSIVE METABOLIC PANEL
ALT: 45 U/L — ABNORMAL HIGH (ref 0–44)
AST: 63 U/L — ABNORMAL HIGH (ref 15–41)
Albumin: 3 g/dL — ABNORMAL LOW (ref 3.5–5.0)
Alkaline Phosphatase: 33 U/L — ABNORMAL LOW (ref 38–126)
Anion gap: 10 (ref 5–15)
BUN: 9 mg/dL (ref 6–20)
CO2: 18 mmol/L — ABNORMAL LOW (ref 22–32)
Calcium: 7.3 mg/dL — ABNORMAL LOW (ref 8.9–10.3)
Chloride: 110 mmol/L (ref 98–111)
Creatinine, Ser: 0.76 mg/dL (ref 0.61–1.24)
GFR calc Af Amer: >60 mL/min (ref >60)
GFR calc non Af Amer: >60 mL/min (ref >60)
Glucose, Bld: 166 mg/dL — ABNORMAL HIGH (ref 70–99)
Potassium: 4 mmol/L (ref 3.5–5.1)
Sodium: 138 mmol/L (ref 135–145)
Total Bilirubin: 0.4 mg/dL (ref 0.3–1.2)
Total Protein: 4.7 g/dL — ABNORMAL LOW (ref 6.5–8.1)

## 2019-08-05 LAB — BASIC METABOLIC PANEL
Anion gap: 10 (ref 5–15)
BUN: 9 mg/dL (ref 6–20)
CO2: 20 mmol/L — ABNORMAL LOW (ref 22–32)
Calcium: 7.4 mg/dL — ABNORMAL LOW (ref 8.9–10.3)
Chloride: 107 mmol/L (ref 98–111)
Creatinine, Ser: 0.83 mg/dL (ref 0.61–1.24)
GFR calc Af Amer: >60 mL/min (ref >60)
GFR calc non Af Amer: >60 mL/min (ref >60)
Glucose, Bld: 156 mg/dL — ABNORMAL HIGH (ref 70–99)
Potassium: 4.4 mmol/L (ref 3.5–5.1)
Sodium: 137 mmol/L (ref 135–145)

## 2019-08-05 LAB — POCT I-STAT 7, (LYTES, BLD GAS, ICA,H+H)
Acid-base deficit: 12 mmol/L — ABNORMAL HIGH (ref 0.0–2.0)
Bicarbonate: 17 mmol/L — ABNORMAL LOW (ref 20.0–28.0)
Calcium, Ion: 1.06 mmol/L — ABNORMAL LOW (ref 1.15–1.40)
HCT: 37 % — ABNORMAL LOW (ref 39.0–52.0)
Hemoglobin: 12.6 g/dL — ABNORMAL LOW (ref 13.0–17.0)
O2 Saturation: 100 %
Patient temperature: 36.6
Potassium: 3.5 mmol/L (ref 3.5–5.1)
Sodium: 139 mmol/L (ref 135–145)
TCO2: 18 mmol/L — ABNORMAL LOW (ref 22–32)
pCO2 arterial: 48.7 mmHg — ABNORMAL HIGH (ref 32.0–48.0)
pH, Arterial: 7.148 — CL (ref 7.350–7.450)
pO2, Arterial: 476 mmHg — ABNORMAL HIGH (ref 83.0–108.0)

## 2019-08-05 LAB — CBC
HCT: 38.3 % — ABNORMAL LOW (ref 39.0–52.0)
HCT: 37.2 % — ABNORMAL LOW (ref 39.0–52.0)
Hemoglobin: 13.2 g/dL (ref 13.0–17.0)
Hemoglobin: 13.5 g/dL (ref 13.0–17.0)
MCH: 32.9 pg (ref 26.0–34.0)
MCH: 33.7 pg (ref 26.0–34.0)
MCHC: 34.5 g/dL (ref 30.0–36.0)
MCHC: 36.3 g/dL — ABNORMAL HIGH (ref 30.0–36.0)
MCV: 95.5 fL (ref 80.0–100.0)
MCV: 92.8 fL (ref 80.0–100.0)
Platelets: 130 10*3/uL — ABNORMAL LOW (ref 150–400)
Platelets: 131 10*3/uL — ABNORMAL LOW (ref 150–400)
RBC: 4.01 MIL/uL — ABNORMAL LOW (ref 4.22–5.81)
RBC: 4.01 MIL/uL — ABNORMAL LOW (ref 4.22–5.81)
RDW: 14.2 % (ref 11.5–15.5)
RDW: 14.3 % (ref 11.5–15.5)
WBC: 12.7 10*3/uL — ABNORMAL HIGH (ref 4.0–10.5)
WBC: 14.5 10*3/uL — ABNORMAL HIGH (ref 4.0–10.5)
nRBC: 0 % (ref 0.0–0.2)
nRBC: 0 % (ref 0.0–0.2)

## 2019-08-05 LAB — HIV ANTIBODY (ROUTINE TESTING W REFLEX): HIV Screen 4th Generation wRfx: NONREACTIVE

## 2019-08-05 LAB — LACTIC ACID, PLASMA: Lactic Acid, Venous: 1.2 mmol/L (ref 0.5–1.9)

## 2019-08-05 SURGERY — INSERTION, INTRAMEDULLARY ROD, TIBIA
Anesthesia: General | Site: Leg Lower | Laterality: Right

## 2019-08-05 MED ORDER — CEFAZOLIN SODIUM-DEXTROSE 1-4 GM/50ML-% IV SOLN
INTRAVENOUS | Status: AC
  Filled 2019-08-05: qty 50

## 2019-08-05 MED ORDER — HYDROMORPHONE HCL 1 MG/ML IJ SOLN
1.0000 mg | INTRAMUSCULAR | Status: DC | PRN
  Administered 2019-08-05 – 2019-08-06 (×3): 1 mg via INTRAVENOUS
  Filled 2019-08-05 (×3): qty 1

## 2019-08-05 MED ORDER — PROPOFOL 10 MG/ML IV BOLUS
INTRAVENOUS | Status: DC | PRN
  Administered 2019-08-05: 50 mg via INTRAVENOUS
  Administered 2019-08-05: 150 mg via INTRAVENOUS

## 2019-08-05 MED ORDER — OXYCODONE HCL 5 MG PO TABS: 5.0000 mg | ORAL_TABLET | ORAL | Status: DC | PRN

## 2019-08-05 MED ORDER — FENTANYL CITRATE (PF) 250 MCG/5ML IJ SOLN
INTRAMUSCULAR | Status: AC
  Filled 2019-08-05: qty 5

## 2019-08-05 MED ORDER — TOBRAMYCIN SULFATE 1.2 G IJ SOLR
INTRAMUSCULAR | Status: AC
  Filled 2019-08-05: qty 1.2

## 2019-08-05 MED ORDER — SUGAMMADEX SODIUM 200 MG/2ML IV SOLN
INTRAVENOUS | Status: DC | PRN
  Administered 2019-08-05: 200 mg via INTRAVENOUS

## 2019-08-05 MED ORDER — DEXMEDETOMIDINE HCL 200 MCG/2ML IV SOLN
INTRAVENOUS | Status: DC | PRN
  Administered 2019-08-05: 30 ug via INTRAVENOUS
  Administered 2019-08-05: 10 ug via INTRAVENOUS

## 2019-08-05 MED ORDER — MIDAZOLAM HCL 5 MG/5ML IJ SOLN
INTRAMUSCULAR | Status: DC | PRN
  Administered 2019-08-05: 2 mg via INTRAVENOUS

## 2019-08-05 MED ORDER — SUCCINYLCHOLINE CHLORIDE 200 MG/10ML IV SOSY
PREFILLED_SYRINGE | INTRAVENOUS | Status: AC
  Filled 2019-08-05: qty 10

## 2019-08-05 MED ORDER — VANCOMYCIN HCL 1000 MG IV SOLR
INTRAVENOUS | Status: DC | PRN
  Administered 2019-08-05: 1000 mg

## 2019-08-05 MED ORDER — VASOPRESSIN 20 UNIT/ML IV SOLN
INTRAVENOUS | Status: DC | PRN
  Administered 2019-08-05: 2 [IU] via INTRAVENOUS
  Administered 2019-08-05: 1 [IU] via INTRAVENOUS

## 2019-08-05 MED ORDER — LIDOCAINE 2% (20 MG/ML) 5 ML SYRINGE
INTRAMUSCULAR | Status: DC | PRN
  Administered 2019-08-05: 100 mg via INTRAVENOUS

## 2019-08-05 MED ORDER — ENOXAPARIN SODIUM 40 MG/0.4ML ~~LOC~~ SOLN: 40.0000 mg | SUBCUTANEOUS | Status: DC

## 2019-08-05 MED ORDER — FENTANYL CITRATE (PF) 100 MCG/2ML IJ SOLN
INTRAMUSCULAR | Status: DC | PRN
  Administered 2019-08-05 (×7): 50 ug via INTRAVENOUS

## 2019-08-05 MED ORDER — 0.9 % SODIUM CHLORIDE (POUR BTL) OPTIME
TOPICAL | Status: DC | PRN
  Administered 2019-08-05: 1000 mL

## 2019-08-05 MED ORDER — DEXAMETHASONE SODIUM PHOSPHATE 10 MG/ML IJ SOLN
INTRAMUSCULAR | Status: AC
  Filled 2019-08-05: qty 1

## 2019-08-05 MED ORDER — THIAMINE HCL 100 MG/ML IJ SOLN
Freq: Once | INTRAVENOUS | Status: DC
  Filled 2019-08-05: qty 1000

## 2019-08-05 MED ORDER — LIDOCAINE 2% (20 MG/ML) 5 ML SYRINGE
INTRAMUSCULAR | Status: AC
  Filled 2019-08-05: qty 5

## 2019-08-05 MED ORDER — VANCOMYCIN HCL 1000 MG IV SOLR
INTRAVENOUS | Status: AC
  Filled 2019-08-05: qty 1000

## 2019-08-05 MED ORDER — SUCCINYLCHOLINE CHLORIDE 200 MG/10ML IV SOSY
PREFILLED_SYRINGE | INTRAVENOUS | Status: DC | PRN
  Administered 2019-08-05: 120 mg via INTRAVENOUS

## 2019-08-05 MED ORDER — VASOPRESSIN 20 UNIT/ML IV SOLN
INTRAVENOUS | Status: AC
  Filled 2019-08-05: qty 1

## 2019-08-05 MED ORDER — ROCURONIUM BROMIDE 10 MG/ML (PF) SYRINGE
PREFILLED_SYRINGE | INTRAVENOUS | Status: AC
  Filled 2019-08-05: qty 10

## 2019-08-05 MED ORDER — PROPOFOL 10 MG/ML IV BOLUS
INTRAVENOUS | Status: AC
  Filled 2019-08-05: qty 20

## 2019-08-05 MED ORDER — PROMETHAZINE HCL 25 MG/ML IJ SOLN: 6.2500 mg | INTRAMUSCULAR | Status: DC | PRN

## 2019-08-05 MED ORDER — TOBRAMYCIN SULFATE 1.2 G IJ SOLR
INTRAMUSCULAR | Status: DC | PRN
  Administered 2019-08-05: 1.2 g

## 2019-08-05 MED ORDER — PHENYLEPHRINE HCL (PRESSORS) 10 MG/ML IV SOLN
INTRAVENOUS | Status: DC | PRN
  Administered 2019-08-05 (×4): 120 ug via INTRAVENOUS

## 2019-08-05 MED ORDER — ONDANSETRON HCL 4 MG/2ML IJ SOLN
INTRAMUSCULAR | Status: AC
  Filled 2019-08-05: qty 2

## 2019-08-05 MED ORDER — OXYCODONE HCL 5 MG PO TABS
10.0000 mg | ORAL_TABLET | ORAL | Status: DC | PRN
  Administered 2019-08-06 – 2019-08-07 (×2): 10 mg via ORAL
  Filled 2019-08-05 (×2): qty 2

## 2019-08-05 MED ORDER — LACTATED RINGERS IV SOLN: INTRAVENOUS | Status: DC

## 2019-08-05 MED ORDER — FENTANYL CITRATE (PF) 100 MCG/2ML IJ SOLN
INTRAMUSCULAR | Status: AC
  Filled 2019-08-05: qty 2

## 2019-08-05 MED ORDER — PHENYLEPHRINE HCL-NACL 10-0.9 MG/250ML-% IV SOLN
INTRAVENOUS | Status: DC | PRN
  Administered 2019-08-05: 25 ug/min via INTRAVENOUS

## 2019-08-05 MED ORDER — ROCURONIUM BROMIDE 50 MG/5ML IV SOSY
PREFILLED_SYRINGE | INTRAVENOUS | Status: DC | PRN
  Administered 2019-08-05: 20 mg via INTRAVENOUS
  Administered 2019-08-05: 50 mg via INTRAVENOUS
  Administered 2019-08-05: 20 mg via INTRAVENOUS

## 2019-08-05 MED ORDER — CEFAZOLIN SODIUM-DEXTROSE 1-4 GM/50ML-% IV SOLN: 1.0000 g | INTRAVENOUS | Status: DC

## 2019-08-05 MED ORDER — MIDAZOLAM HCL 2 MG/2ML IJ SOLN
INTRAMUSCULAR | Status: AC
  Filled 2019-08-05: qty 2

## 2019-08-05 MED ORDER — SODIUM CHLORIDE 0.9 % IR SOLN
Status: DC | PRN
  Administered 2019-08-05 (×2): 3000 mL

## 2019-08-05 MED ORDER — DEXAMETHASONE SODIUM PHOSPHATE 10 MG/ML IJ SOLN
INTRAMUSCULAR | Status: DC | PRN
  Administered 2019-08-05: 10 mg via INTRAVENOUS

## 2019-08-05 MED ORDER — METHOCARBAMOL 1000 MG/10ML IJ SOLN
1000.0000 mg | Freq: Three times a day (TID) | INTRAVENOUS | Status: DC
  Administered 2019-08-06 – 2019-08-08 (×8): 1000 mg via INTRAVENOUS
  Filled 2019-08-05 (×12): qty 10

## 2019-08-05 MED ORDER — PHENYLEPHRINE 40 MCG/ML (10ML) SYRINGE FOR IV PUSH (FOR BLOOD PRESSURE SUPPORT)
PREFILLED_SYRINGE | INTRAVENOUS | Status: AC
  Filled 2019-08-05: qty 10

## 2019-08-05 MED ORDER — CEFAZOLIN SODIUM-DEXTROSE 2-3 GM-%(50ML) IV SOLR
INTRAVENOUS | Status: DC | PRN
  Administered 2019-08-05: 2 g via INTRAVENOUS

## 2019-08-05 MED ORDER — SODIUM CHLORIDE 0.9 % IV SOLN
2.0000 g | INTRAVENOUS | Status: DC
  Administered 2019-08-05: 2 g via INTRAVENOUS
  Filled 2019-08-05 (×2): qty 20

## 2019-08-05 MED ORDER — ALBUMIN HUMAN 5 % IV SOLN: INTRAVENOUS | Status: DC | PRN

## 2019-08-05 MED ORDER — FENTANYL CITRATE (PF) 100 MCG/2ML IJ SOLN
25.0000 ug | INTRAMUSCULAR | Status: DC | PRN
  Administered 2019-08-05 (×2): 50 ug via INTRAVENOUS

## 2019-08-05 SURGICAL SUPPLY — 78 items
APL PRP STRL LF DISP 70% ISPRP (MISCELLANEOUS) ×2
BIT DRILL 3.8X6 NS (BIT) ×4 IMPLANT
BIT DRILL 4.4 NS (BIT) ×2 IMPLANT
BLADE SURG 10 STRL SS (BLADE) ×8 IMPLANT
BNDG COHESIVE 4X5 TAN STRL (GAUZE/BANDAGES/DRESSINGS) ×4 IMPLANT
BNDG ELASTIC 4X5.8 VLCR STR LF (GAUZE/BANDAGES/DRESSINGS) ×4 IMPLANT
BNDG ELASTIC 6X5.8 VLCR STR LF (GAUZE/BANDAGES/DRESSINGS) ×4 IMPLANT
BNDG GAUZE ELAST 4 BULKY (GAUZE/BANDAGES/DRESSINGS) ×8 IMPLANT
BRUSH SCRUB EZ PLAIN DRY (MISCELLANEOUS) ×8 IMPLANT
CHLORAPREP W/TINT 26 (MISCELLANEOUS) ×4 IMPLANT
COVER MAYO STAND STRL (DRAPES) ×4 IMPLANT
COVER SURGICAL LIGHT HANDLE (MISCELLANEOUS) ×8 IMPLANT
COVER WAND RF STERILE (DRAPES) ×4 IMPLANT
DRAPE C-ARM 42X72 X-RAY (DRAPES) ×4 IMPLANT
DRAPE C-ARMOR (DRAPES) ×4 IMPLANT
DRAPE HALF SHEET 40X57 (DRAPES) ×8 IMPLANT
DRAPE IMP U-DRAPE 54X76 (DRAPES) ×8 IMPLANT
DRAPE INCISE IOBAN 66X45 STRL (DRAPES) IMPLANT
DRAPE ORTHO SPLIT 77X108 STRL (DRAPES) ×8
DRAPE SURG 17X23 STRL (DRAPES) ×4 IMPLANT
DRAPE SURG ORHT 6 SPLT 77X108 (DRAPES) ×4 IMPLANT
DRAPE U-SHAPE 47X51 STRL (DRAPES) ×4 IMPLANT
DRSG ADAPTIC 3X8 NADH LF (GAUZE/BANDAGES/DRESSINGS) ×4 IMPLANT
DRSG MEPITEL 4X7.2 (GAUZE/BANDAGES/DRESSINGS) ×2 IMPLANT
ELECT REM PT RETURN 9FT ADLT (ELECTROSURGICAL) ×4
ELECTRODE REM PT RTRN 9FT ADLT (ELECTROSURGICAL) ×2 IMPLANT
EVACUATOR 1/8 PVC DRAIN (DRAIN) IMPLANT
GAUZE SPONGE 4X4 12PLY STRL (GAUZE/BANDAGES/DRESSINGS) ×4 IMPLANT
GAUZE SPONGE 4X4 12PLY STRL LF (GAUZE/BANDAGES/DRESSINGS) ×2 IMPLANT
GLOVE BIO SURGEON STRL SZ 6.5 (GLOVE) ×9 IMPLANT
GLOVE BIO SURGEON STRL SZ7.5 (GLOVE) ×16 IMPLANT
GLOVE BIO SURGEONS STRL SZ 6.5 (GLOVE) ×3
GLOVE BIOGEL PI IND STRL 6.5 (GLOVE) ×2 IMPLANT
GLOVE BIOGEL PI IND STRL 7.5 (GLOVE) ×2 IMPLANT
GLOVE BIOGEL PI INDICATOR 6.5 (GLOVE) ×2
GLOVE BIOGEL PI INDICATOR 7.5 (GLOVE) ×2
GOWN STRL REUS W/ TWL LRG LVL3 (GOWN DISPOSABLE) ×4 IMPLANT
GOWN STRL REUS W/TWL LRG LVL3 (GOWN DISPOSABLE) ×8
GUIDEPIN 3.2X17.5 THRD DISP (PIN) ×2 IMPLANT
GUIDEWIRE BALL NOSE 80CM (WIRE) ×2 IMPLANT
HANDPIECE INTERPULSE COAX TIP (DISPOSABLE)
KIT BASIN OR (CUSTOM PROCEDURE TRAY) ×4 IMPLANT
KIT PREVENA INCISION MGT 13 (CANNISTER) ×2 IMPLANT
KIT TURNOVER KIT B (KITS) ×4 IMPLANT
MANIFOLD NEPTUNE II (INSTRUMENTS) ×4 IMPLANT
NAIL TIBIAL 9MMX34.5CM (Nail) ×2 IMPLANT
NS IRRIG 1000ML POUR BTL (IV SOLUTION) ×4 IMPLANT
PACK ORTHO EXTREMITY (CUSTOM PROCEDURE TRAY) ×4 IMPLANT
PACK TOTAL JOINT (CUSTOM PROCEDURE TRAY) ×4 IMPLANT
PAD ARMBOARD 7.5X6 YLW CONV (MISCELLANEOUS) ×8 IMPLANT
PAD CAST 4YDX4 CTTN HI CHSV (CAST SUPPLIES) IMPLANT
PADDING CAST COTTON 4X4 STRL (CAST SUPPLIES) ×4
PADDING CAST COTTON 6X4 STRL (CAST SUPPLIES) ×4 IMPLANT
SCREW ACECAP 34MM (Screw) ×2 IMPLANT
SCREW ACECAP 42MM (Screw) ×2 IMPLANT
SCREW PROXIMAL DEPUY (Screw) ×8 IMPLANT
SCREW PRXML FT 40X5.5XNS LF (Screw) IMPLANT
SCREW PRXML FT 45X5.5XLCK NS (Screw) IMPLANT
SET HNDPC FAN SPRY TIP SCT (DISPOSABLE) IMPLANT
SPONGE LAP 18X18 RF (DISPOSABLE) ×4 IMPLANT
STAPLER VISISTAT 35W (STAPLE) ×4 IMPLANT
SUT ETHILON 2 0 FS 18 (SUTURE) ×10 IMPLANT
SUT ETHILON 3 0 PS 1 (SUTURE) ×8 IMPLANT
SUT MNCRL AB 3-0 PS2 18 (SUTURE) ×4 IMPLANT
SUT MON AB 2-0 CT1 36 (SUTURE) ×4 IMPLANT
SUT PDS AB 0 CT 36 (SUTURE) IMPLANT
SUT VIC AB 0 CT1 27 (SUTURE)
SUT VIC AB 0 CT1 27XBRD ANBCTR (SUTURE) IMPLANT
SUT VIC AB 2-0 CT1 27 (SUTURE)
SUT VIC AB 2-0 CT1 TAPERPNT 27 (SUTURE) IMPLANT
SWAB CULTURE ESWAB REG 1ML (MISCELLANEOUS) IMPLANT
TOWEL GREEN STERILE (TOWEL DISPOSABLE) ×8 IMPLANT
TOWEL GREEN STERILE FF (TOWEL DISPOSABLE) ×4 IMPLANT
TUBE CONNECTING 12'X1/4 (SUCTIONS) ×1
TUBE CONNECTING 12X1/4 (SUCTIONS) ×3 IMPLANT
UNDERPAD 30X30 (UNDERPADS AND DIAPERS) ×4 IMPLANT
WATER STERILE IRR 1000ML POUR (IV SOLUTION) ×4 IMPLANT
YANKAUER SUCT BULB TIP NO VENT (SUCTIONS) ×4 IMPLANT

## 2019-08-05 NOTE — Progress Notes (Signed)
Received patient from PACU. Pt alert and oriented. No complaints of pain at this time. ACE wrap clean dry and intact. Full sensation on RLE, patient able to move toes. Will continue to monitor.

## 2019-08-05 NOTE — Anesthesia Preprocedure Evaluation (Signed)
Anesthesia Evaluation  Patient identified by MRN, date of birth, ID band Patient awake    Reviewed: Allergy & Precautions, NPO status , Patient's Chart, lab work & pertinent test results  Airway Mallampati: II  TM Distance: >3 FB Neck ROM: Full    Dental  (+) Dental Advisory Given, Missing, Poor Dentition, Partial Upper   Pulmonary Current Smoker and Patient abstained from smoking.,    Pulmonary exam normal breath sounds clear to auscultation       Cardiovascular negative cardio ROS   Rhythm:Regular Rate:Tachycardia     Neuro/Psych negative neurological ROS     GI/Hepatic negative GI ROS, (+)     substance abuse  alcohol use, S/p splenectomy    Endo/Other  negative endocrine ROS  Renal/GU negative Renal ROS     Musculoskeletal Right Tibia Fracture fracture   Abdominal   Peds  Hematology  (+) Blood dyscrasia (Thrombocytopenia), ,   Anesthesia Other Findings Day of surgery medications reviewed with the patient.  Reproductive/Obstetrics                             Anesthesia Physical Anesthesia Plan  ASA: II  Anesthesia Plan: General   Post-op Pain Management:    Induction: Intravenous  PONV Risk Score and Plan: 2 and Midazolam, Dexamethasone and Ondansetron  Airway Management Planned: Oral ETT  Additional Equipment:   Intra-op Plan:   Post-operative Plan: Extubation in OR  Informed Consent: I have reviewed the patients History and Physical, chart, labs and discussed the procedure including the risks, benefits and alternatives for the proposed anesthesia with the patient or authorized representative who has indicated his/her understanding and acceptance.     Dental advisory given  Plan Discussed with: CRNA  Anesthesia Plan Comments:         Anesthesia Quick Evaluation

## 2019-08-05 NOTE — Anesthesia Postprocedure Evaluation (Signed)
Anesthesia Post Note  Patient: Paul Gregory  Procedure(s) Performed: EXPLORATORY LAPAROTOMY, splenectomy, small bowel resection (N/A Abdomen)     Patient location during evaluation: PACU Anesthesia Type: General Level of consciousness: awake and alert Pain management: pain level controlled Vital Signs Assessment: post-procedure vital signs reviewed and stable Respiratory status: spontaneous breathing, nonlabored ventilation, respiratory function stable and patient connected to nasal cannula oxygen Cardiovascular status: blood pressure returned to baseline and stable Postop Assessment: no apparent nausea or vomiting Anesthetic complications: no    Last Vitals:  Vitals:   08/04/19 2200 08/05/19 0000  BP: 140/90   Pulse: (!) 132   Resp: 18   Temp:  36.8 C  SpO2: 92%     Last Pain:  Vitals:   08/05/19 0000  TempSrc: Oral  PainSc:                  Ragna Kramlich,W. EDMOND

## 2019-08-05 NOTE — Progress Notes (Signed)
OT Cancellation Note  Patient at procedure or test/ unavailable. Pt in OR at this time. OTR to evaluate next available tx day.  Jefferey Pica., OT Acute Rehabilitation Services Pager: [phone] Office: [phone]

## 2019-08-05 NOTE — Transfer of Care (Signed)
Immediate Anesthesia Transfer of Care Note  Patient: Paul Gregory  Procedure(s) Performed: INTRAMEDULLARY (IM) NAIL TIBIAL (Right ) IRRIGATION AND DEBRIDEMENT RIGHT LEG (Right ) Application Of Wound Vac (Right Leg Lower)  Patient Location: PACU  Anesthesia Type:General  Level of Consciousness: awake and patient cooperative  Airway & Oxygen Therapy: Patient Spontanous Breathing and Patient connected to face mask oxygen  Post-op Assessment: Report given to RN and Post -op Vital signs reviewed and stable  Post vital signs: Reviewed and stable  Last Vitals:  Vitals Value Taken Time  BP 148/105 08/05/19 1351  Temp    Pulse 114 08/05/19 1351  Resp 18 08/05/19 1351  SpO2 100 % 08/05/19 1351  Vitals shown include unvalidated device data.  Last Pain:  Vitals:   08/05/19 0800  TempSrc: Axillary  PainSc: 9          Complications: No apparent anesthesia complications

## 2019-08-05 NOTE — Progress Notes (Signed)
Orthopedic Tech Progress Note Patient Details:  Paul Gregory April 21, 1971 [id number]  Ortho Devices Type of Ortho Device: CAM walker Ortho Device/Splint Location: RLE Ortho Device/Splint Interventions: Ordered   Post Interventions Patient Tolerated: Fair Instructions Provided: Care of device, Adjustment of device   Braulio Bosch 08/05/2019, 1:13 PM

## 2019-08-05 NOTE — Anesthesia Procedure Notes (Signed)
Procedure Name: Intubation Date/Time: 08/05/2019 11:45 AM Performed by: Orlie Dakin, CRNA Pre-anesthesia Checklist: Patient identified, Emergency Drugs available, Suction available and Patient being monitored Patient Re-evaluated:Patient Re-evaluated prior to induction Oxygen Delivery Method: Circle system utilized Preoxygenation: Pre-oxygenation with 100% oxygen Induction Type: Cricoid Pressure applied and IV induction Laryngoscope Size: Mac and 4 Grade View: Grade I Tube type: Oral Tube size: 7.5 mm Number of attempts: 1 Airway Equipment and Method: Stylet Placement Confirmation: positive ETCO2,  ETT inserted through vocal cords under direct vision and breath sounds checked- equal and bilateral Secured at: 22 cm Tube secured with: Tape Dental Injury: Teeth and Oropharynx as per pre-operative assessment
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
## 2019-08-05 NOTE — Progress Notes (Addendum)
PT Cancellation Note  Patient Details Name: Paul Gregory MRN: [id number] DOB: 06/02/1971   Cancelled Treatment:    Reason Eval/Treat Not Completed: Patient at procedure or test/unavailable.  In OR.  Will hold evaluation today.  Will see 12/17 as able. 08/05/2019  [name]., PT Acute Rehabilitation Services [phone]  (pager) [phone]  (office)   [name] 08/05/2019, 1:00 PM
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
## 2019-08-06 LAB — CBC
HCT: 31.7 % — ABNORMAL LOW (ref 39.0–52.0)
HCT: 29.9 % — ABNORMAL LOW (ref 39.0–52.0)
Hemoglobin: 10.8 g/dL — ABNORMAL LOW (ref 13.0–17.0)
Hemoglobin: 10.6 g/dL — ABNORMAL LOW (ref 13.0–17.0)
MCH: 32.5 pg (ref 26.0–34.0)
MCH: 33.1 pg (ref 26.0–34.0)
MCHC: 34.1 g/dL (ref 30.0–36.0)
MCHC: 35.5 g/dL (ref 30.0–36.0)
MCV: 95.5 fL (ref 80.0–100.0)
MCV: 93.4 fL (ref 80.0–100.0)
Platelets: 124 10*3/uL — ABNORMAL LOW (ref 150–400)
Platelets: 135 10*3/uL — ABNORMAL LOW (ref 150–400)
RBC: 3.32 MIL/uL — ABNORMAL LOW (ref 4.22–5.81)
RBC: 3.2 MIL/uL — ABNORMAL LOW (ref 4.22–5.81)
RDW: 14 % (ref 11.5–15.5)
RDW: 13.7 % (ref 11.5–15.5)
WBC: 14.1 10*3/uL — ABNORMAL HIGH (ref 4.0–10.5)
WBC: 14 10*3/uL — ABNORMAL HIGH (ref 4.0–10.5)
nRBC: 0 % (ref 0.0–0.2)
nRBC: 0 % (ref 0.0–0.2)

## 2019-08-06 LAB — PHOSPHORUS: Phosphorus: 2.3 mg/dL — ABNORMAL LOW (ref 2.5–4.6)

## 2019-08-06 LAB — MAGNESIUM: Magnesium: 1.6 mg/dL — ABNORMAL LOW (ref 1.7–2.4)

## 2019-08-06 LAB — VITAMIN D 25 HYDROXY (VIT D DEFICIENCY, FRACTURES): Vit D, 25-Hydroxy: 26.57 ng/mL — ABNORMAL LOW (ref 30–100)

## 2019-08-06 LAB — BLOOD PRODUCT ORDER (VERBAL) VERIFICATION

## 2019-08-06 MED ORDER — MAGNESIUM SULFATE 50 % IJ SOLN: 1.0000 g | Freq: Once | INTRAMUSCULAR | Status: DC

## 2019-08-06 MED ORDER — LORAZEPAM 1 MG PO TABS: 1.0000 mg | ORAL_TABLET | ORAL | Status: AC | PRN

## 2019-08-06 MED ORDER — LORAZEPAM 2 MG/ML IJ SOLN: 1.0000 mg | INTRAMUSCULAR | Status: AC | PRN

## 2019-08-06 MED ORDER — SODIUM PHOSPHATES 45 MMOLE/15ML IV SOLN
10.0000 mmol | Freq: Once | INTRAVENOUS | Status: AC
  Administered 2019-08-06: 10 mmol via INTRAVENOUS
  Filled 2019-08-06: qty 3.33

## 2019-08-06 MED ORDER — ADULT MULTIVITAMIN W/MINERALS CH
1.0000 | ORAL_TABLET | Freq: Every day | ORAL | Status: DC
  Administered 2019-08-06 – 2019-08-08 (×3): 1 via ORAL
  Filled 2019-08-06 (×3): qty 1

## 2019-08-06 MED ORDER — MAGNESIUM SULFATE IN D5W 1-5 GM/100ML-% IV SOLN
1.0000 g | Freq: Once | INTRAVENOUS | Status: AC
  Administered 2019-08-06: 1 g via INTRAVENOUS
  Filled 2019-08-06: qty 100

## 2019-08-06 MED ORDER — HYDROMORPHONE HCL 1 MG/ML IJ SOLN
1.0000 mg | INTRAMUSCULAR | Status: DC | PRN
  Administered 2019-08-06 – 2019-08-07 (×5): 1 mg via INTRAVENOUS
  Filled 2019-08-06 (×5): qty 1

## 2019-08-06 NOTE — Anesthesia Postprocedure Evaluation (Signed)
Anesthesia Post Note  Patient: Paul Gregory  Procedure(s) Performed: INTRAMEDULLARY (IM) NAIL TIBIAL (Right ) IRRIGATION AND DEBRIDEMENT RIGHT LEG (Right ) Application Of Wound Vac (Right Leg Lower)     Patient location during evaluation: PACU Anesthesia Type: General Level of consciousness: awake and alert Pain management: pain level controlled Vital Signs Assessment: post-procedure vital signs reviewed and stable Respiratory status: spontaneous breathing, nonlabored ventilation and respiratory function stable Cardiovascular status: blood pressure returned to baseline and stable Postop Assessment: no apparent nausea or vomiting Anesthetic complications: no    Last Vitals:  Vitals:   08/06/19 0955 08/06/19 1242  BP: (!) 138/92   Pulse: 100   Resp: 20   Temp: 36.8 C   SpO2: 100% 93%    Last Pain:  Vitals:   08/06/19 0955  TempSrc: Oral  PainSc:                  Catalina Gravel
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
## 2019-08-07 ENCOUNTER — Encounter: Payer: Self-pay | Admitting: *Deleted

## 2019-08-07 LAB — TYPE AND SCREEN
ABO/RH(D): O NEG
Antibody Screen: NEGATIVE
Unit division: 0
Unit division: 0
Unit division: 0
Unit division: 0
Unit division: 0
Unit division: 0

## 2019-08-07 LAB — BPAM RBC
Blood Product Expiration Date: 202012202359
Blood Product Expiration Date: 202012282359
Blood Product Expiration Date: 202012282359
Blood Product Expiration Date: 202101032359
Blood Product Expiration Date: 202101162359
Blood Product Expiration Date: 202101162359
ISSUE DATE / TIME: 202012151845
ISSUE DATE / TIME: 202012151845
ISSUE DATE / TIME: 202012151919
ISSUE DATE / TIME: 202012151919
Unit Type and Rh: 5100
Unit Type and Rh: 5100
Unit Type and Rh: 9500
Unit Type and Rh: 9500
Unit Type and Rh: 9500
Unit Type and Rh: 9500

## 2019-08-07 LAB — BASIC METABOLIC PANEL
Anion gap: 11 (ref 5–15)
BUN: 6 mg/dL (ref 6–20)
CO2: 20 mmol/L — ABNORMAL LOW (ref 22–32)
Calcium: 8.2 mg/dL — ABNORMAL LOW (ref 8.9–10.3)
Chloride: 105 mmol/L (ref 98–111)
Creatinine, Ser: 0.77 mg/dL (ref 0.61–1.24)
GFR calc Af Amer: >60 mL/min (ref >60)
GFR calc non Af Amer: >60 mL/min (ref >60)
Glucose, Bld: 99 mg/dL (ref 70–99)
Potassium: 3.6 mmol/L (ref 3.5–5.1)
Sodium: 136 mmol/L (ref 135–145)

## 2019-08-07 LAB — PHOSPHORUS: Phosphorus: 2.9 mg/dL (ref 2.5–4.6)

## 2019-08-07 LAB — CBC
HCT: 29.3 % — ABNORMAL LOW (ref 39.0–52.0)
Hemoglobin: 10.2 g/dL — ABNORMAL LOW (ref 13.0–17.0)
MCH: 33 pg (ref 26.0–34.0)
MCHC: 34.8 g/dL (ref 30.0–36.0)
MCV: 94.8 fL (ref 80.0–100.0)
Platelets: 156 10*3/uL (ref 150–400)
RBC: 3.09 MIL/uL — ABNORMAL LOW (ref 4.22–5.81)
RDW: 13.6 % (ref 11.5–15.5)
WBC: 12.4 10*3/uL — ABNORMAL HIGH (ref 4.0–10.5)
nRBC: 0 % (ref 0.0–0.2)

## 2019-08-07 LAB — MAGNESIUM: Magnesium: 1.9 mg/dL (ref 1.7–2.4)

## 2019-08-07 MED ORDER — SODIUM CHLORIDE 0.9 % IV SOLN: INTRAVENOUS | Status: DC

## 2019-08-07 MED ORDER — MAGNESIUM SULFATE 2 GM/50ML IV SOLN
2.0000 g | Freq: Once | INTRAVENOUS | Status: AC
  Administered 2019-08-07: 2 g via INTRAVENOUS
  Filled 2019-08-07: qty 50

## 2019-08-07 MED ORDER — POTASSIUM CHLORIDE 20 MEQ/15ML (10%) PO SOLN
40.0000 meq | Freq: Once | ORAL | Status: AC
  Administered 2019-08-07: 40 meq via ORAL
  Filled 2019-08-07: qty 30

## 2019-08-07 MED ORDER — ENOXAPARIN SODIUM 40 MG/0.4ML ~~LOC~~ SOLN
40.0000 mg | Freq: Every day | SUBCUTANEOUS | Status: DC
  Administered 2019-08-07 – 2019-08-08 (×2): 40 mg via SUBCUTANEOUS
  Filled 2019-08-07 (×2): qty 0.4

## 2019-08-07 MED ORDER — POTASSIUM PHOSPHATES 15 MMOLE/5ML IV SOLN
10.0000 mmol | Freq: Once | INTRAVENOUS | Status: AC
  Administered 2019-08-07: 10 mmol via INTRAVENOUS
  Filled 2019-08-07: qty 3.33

## 2019-08-07 MED ORDER — GUAIFENESIN 100 MG/5ML PO SOLN
5.0000 mL | Freq: Four times a day (QID) | ORAL | Status: DC | PRN
  Filled 2019-08-07: qty 5

## 2019-08-07 MED ORDER — OXYCODONE HCL 5 MG PO TABS
10.0000 mg | ORAL_TABLET | ORAL | Status: DC | PRN
  Administered 2019-08-07 – 2019-08-09 (×8): 15 mg via ORAL
  Filled 2019-08-07 (×8): qty 3

## 2019-08-07 MED ORDER — VITAMIN D 25 MCG (1000 UNIT) PO TABS
2000.0000 [IU] | ORAL_TABLET | Freq: Two times a day (BID) | ORAL | Status: DC
  Administered 2019-08-07 – 2019-08-08 (×4): 2000 [IU] via ORAL
  Filled 2019-08-07 (×4): qty 2

## 2019-08-07 MED ORDER — HYDROMORPHONE HCL 1 MG/ML IJ SOLN: 0.5000 mg | Freq: Four times a day (QID) | INTRAMUSCULAR | Status: DC | PRN
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
## 2019-08-07 NOTE — Progress Notes (Signed)
SATURATION QUALIFICATIONS: (This note is used to comply with regulatory documentation for home oxygen)  Patient Saturations on Room Air at Rest = 93%  Patient Saturations on Room Air while Ambulating = 88%  Patient Saturations on 0.5 Liters of oxygen while Ambulating = 93%  Please briefly explain why patient needs home oxygen: Pt unable to maintain SpO2 > 90% without O2 during mobility.   [place], DPT   Acute Rehabilitation Department [phone]
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
## 2019-08-07 NOTE — TOC Progression Note (Signed)
Transition of Care [facility] - Progression Note    Patient Details  Name: Paul Gregory MRN: [id number] Date of Birth: 12/15/1970  Transition of Care [facility]) CM/SW Contact  Ella Bodo, RN Phone Number: 08/07/2019, 1:54 PM  Clinical Narrative: Referral to [facility] for recommended DME.          Barriers to Discharge: Continued Medical Work up  Expected Discharge Plan and Services     Discharge Planning Services: CM Consult, [facility], [place], Medication Assistance   Living arrangements for the past 2 months: [place]                 DME Arranged: 3-N-1, Environmental consultant, Wheelchair manual DME Agency: [employer] Date DME Agency Contacted: 08/07/19 Time DME Agency Contacted: [phone] Representative spoke with at DME Agency: [place] ([place]) Interventions    Readmission Risk Interventions No flowsheet data found.  Reinaldo Raddle, RN, BSN  Trauma/Neuro ICU Case Manager [phone]

## 2019-08-07 NOTE — Progress Notes (Signed)
Nurse ambulated patient this morning , walked for about 20 feet with walker. Will continue to monitor

## 2019-08-08 ENCOUNTER — Other Ambulatory Visit: Payer: Self-pay

## 2019-08-08 LAB — BASIC METABOLIC PANEL
Anion gap: 8 (ref 5–15)
BUN: 5 mg/dL — ABNORMAL LOW (ref 6–20)
CO2: 22 mmol/L (ref 22–32)
Calcium: 8.1 mg/dL — ABNORMAL LOW (ref 8.9–10.3)
Chloride: 105 mmol/L (ref 98–111)
Creatinine, Ser: 0.83 mg/dL (ref 0.61–1.24)
GFR calc Af Amer: >60 mL/min (ref >60)
GFR calc non Af Amer: >60 mL/min (ref >60)
Glucose, Bld: 107 mg/dL — ABNORMAL HIGH (ref 70–99)
Potassium: 3.8 mmol/L (ref 3.5–5.1)
Sodium: 135 mmol/L (ref 135–145)

## 2019-08-08 LAB — CBC
HCT: 28.8 % — ABNORMAL LOW (ref 39.0–52.0)
Hemoglobin: 9.7 g/dL — ABNORMAL LOW (ref 13.0–17.0)
MCH: 32.6 pg (ref 26.0–34.0)
MCHC: 33.7 g/dL (ref 30.0–36.0)
MCV: 96.6 fL (ref 80.0–100.0)
Platelets: 234 10*3/uL (ref 150–400)
RBC: 2.98 MIL/uL — ABNORMAL LOW (ref 4.22–5.81)
RDW: 13.5 % (ref 11.5–15.5)
WBC: 9.8 10*3/uL (ref 4.0–10.5)
nRBC: 0 % (ref 0.0–0.2)

## 2019-08-08 LAB — MAGNESIUM: Magnesium: 2 mg/dL (ref 1.7–2.4)

## 2019-08-08 LAB — PHOSPHORUS: Phosphorus: 3.5 mg/dL (ref 2.5–4.6)

## 2019-08-08 MED ORDER — METHOCARBAMOL 750 MG PO TABS
750.0000 mg | ORAL_TABLET | Freq: Three times a day (TID) | ORAL | Status: DC
  Administered 2019-08-08 – 2019-08-09 (×4): 750 mg via ORAL
  Filled 2019-08-08 (×4): qty 1

## 2019-08-08 MED ORDER — MENINGOCOCCAL A C Y&W-135 OLIG IM SOLR
0.5000 mL | Freq: Once | INTRAMUSCULAR | Status: AC
  Administered 2019-08-08: 0.5 mL via INTRAMUSCULAR
  Filled 2019-08-08: qty 0.5

## 2019-08-08 MED ORDER — HAEMOPHILUS B POLYSAC CONJ VAC IM SOLR
0.5000 mL | Freq: Once | INTRAMUSCULAR | Status: AC
  Administered 2019-08-08: 0.5 mL via INTRAMUSCULAR
  Filled 2019-08-08: qty 0.5

## 2019-08-08 MED ORDER — PNEUMOCOCCAL 13-VAL CONJ VACC IM SUSP
0.5000 mL | Freq: Once | INTRAMUSCULAR | Status: AC
  Administered 2019-08-08: 0.5 mL via INTRAMUSCULAR
  Filled 2019-08-08: qty 0.5

## 2019-08-08 NOTE — Plan of Care (Signed)
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
## 2019-08-09 LAB — CBC
HCT: 29.8 % — ABNORMAL LOW (ref 39.0–52.0)
Hemoglobin: 10.2 g/dL — ABNORMAL LOW (ref 13.0–17.0)
MCH: 32.9 pg (ref 26.0–34.0)
MCHC: 34.2 g/dL (ref 30.0–36.0)
MCV: 96.1 fL (ref 80.0–100.0)
Platelets: 312 10*3/uL (ref 150–400)
RBC: 3.1 MIL/uL — ABNORMAL LOW (ref 4.22–5.81)
RDW: 13.7 % (ref 11.5–15.5)
WBC: 10.3 10*3/uL (ref 4.0–10.5)
nRBC: 0 % (ref 0.0–0.2)

## 2019-08-09 LAB — BASIC METABOLIC PANEL
Anion gap: 8 (ref 5–15)
BUN: 7 mg/dL (ref 6–20)
CO2: 21 mmol/L — ABNORMAL LOW (ref 22–32)
Calcium: 8.4 mg/dL — ABNORMAL LOW (ref 8.9–10.3)
Chloride: 106 mmol/L (ref 98–111)
Creatinine, Ser: 0.61 mg/dL (ref 0.61–1.24)
GFR calc Af Amer: >60 mL/min (ref >60)
GFR calc non Af Amer: >60 mL/min (ref >60)
Glucose, Bld: 104 mg/dL — ABNORMAL HIGH (ref 70–99)
Potassium: 3.8 mmol/L (ref 3.5–5.1)
Sodium: 135 mmol/L (ref 135–145)

## 2019-08-09 MED ORDER — VITAMIN D3 25 MCG PO TABS: 2000.0000 [IU] | ORAL_TABLET | Freq: Two times a day (BID) | ORAL | Status: DC

## 2019-08-09 MED ORDER — METHOCARBAMOL 750 MG PO TABS: 750.0000 mg | ORAL_TABLET | Freq: Three times a day (TID) | ORAL | 0 refills | Status: DC | PRN

## 2019-08-09 MED ORDER — BACITRACIN ZINC 500 UNIT/GM EX OINT: TOPICAL_OINTMENT | CUTANEOUS | 0 refills | Status: DC

## 2019-08-09 MED ORDER — GABAPENTIN 300 MG PO CAPS: 300.0000 mg | ORAL_CAPSULE | Freq: Three times a day (TID) | ORAL | 0 refills | Status: DC | PRN

## 2019-08-09 MED ORDER — OXYCODONE HCL 10 MG PO TABS: 10.0000 mg | ORAL_TABLET | Freq: Four times a day (QID) | ORAL | 0 refills | Status: DC | PRN

## 2019-08-09 MED ORDER — DOCUSATE SODIUM 100 MG PO CAPS: 100.0000 mg | ORAL_CAPSULE | Freq: Two times a day (BID) | ORAL | 0 refills | Status: DC | PRN

## 2019-08-09 MED ORDER — ADULT MULTIVITAMIN W/MINERALS CH: 1.0000 | ORAL_TABLET | Freq: Every day | ORAL | Status: DC

## 2019-08-09 MED ORDER — ACETAMINOPHEN 325 MG PO TABS: 650.0000 mg | ORAL_TABLET | Freq: Four times a day (QID) | ORAL | Status: DC | PRN
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
## 2019-08-09 NOTE — Progress Notes (Signed)
Patient discharged to home. Verbalized understanding of all discharge instructions including incision care, discharge medications and follow up MD visits. 

## 2019-08-09 NOTE — Discharge Instructions (Signed)
Please wash your right arm with soap and water twice daily. Pat dry  Apply topical bacitracin ointment to the wound twice daily after cleansing with soap and water. After cover with a piece of gauze and wrap loosely with ace bandage or kerlix dressing. We will see you back in the office for a wound check.      [facility], [place] [phone]  OPEN ABDOMINAL SURGERY: POST OP INSTRUCTIONS  Always review your discharge instruction sheet given to you by the facility where your surgery was performed.  IF YOU HAVE DISABILITY OR FAMILY LEAVE FORMS, YOU MUST BRING THEM TO THE OFFICE FOR PROCESSING.  PLEASE DO NOT GIVE THEM TO YOUR DOCTOR.  1. A prescription for pain medication may be given to you upon discharge.  Take your pain medication as prescribed, if needed.  If narcotic pain medicine is not needed, then you may take acetaminophen (Tylenol) or ibuprofen (Advil) as needed. 2. Take your usually prescribed medications unless otherwise directed. 3. If you need a refill on your pain medication, please contact your pharmacy. They will contact our office to request authorization.  Prescriptions will not be filled after 5pm or on week-ends. 4. You should follow a light diet the first few days after arrival home, such as soup and crackers, pudding, etc.unless your doctor has advised otherwise. A high-fiber, low fat diet can be resumed as tolerated.   Be sure to include lots of fluids daily. Most patients will experience some swelling and bruising on the chest and neck area.  Ice packs will help.  Swelling and bruising can take several days to resolve 5. Most patients will experience some swelling and bruising in the area of the incision. Ice pack will help. Swelling and bruising can take several days to resolve..  6. It is common to experience some constipation if taking pain medication after surgery.  Increasing fluid intake and taking a stool softener will usually help or prevent this  problem from occurring.  A mild laxative (Milk of Magnesia or Miralax) should be taken according to package directions if there are no bowel movements after 48 hours. 7.  You may have steri-strips (small skin tapes) in place directly over the incision.  These strips should be left on the skin for 7-10 days.  If your surgeon used skin glue on the incision, you may shower in 24 hours.  The glue will flake off over the next 2-3 weeks.  Any sutures or staples will be removed at the office during your follow-up visit. You may find that a light gauze bandage over your incision may keep your staples from being rubbed or pulled. You may shower and replace the bandage daily. 8. ACTIVITIES:  You may resume regular (light) daily activities beginning the next day--such as daily self-care, walking, climbing stairs--gradually increasing activities as tolerated.  You may have sexual intercourse when it is comfortable.  Refrain from any heavy lifting or straining until approved by your doctor. a. You may drive when you no longer are taking prescription pain medication, you can comfortably wear a seatbelt, and you can safely maneuver your car and apply brakes b. Return to Work: ___________________________________ 70. You should see your doctor in the office for a follow-up appointment approximately two weeks after your surgery.  Make sure that you call for this appointment within a day or two after you arrive home to insure a convenient appointment time. OTHER INSTRUCTIONS:  _____________________________________________________________ _____________________________________________________________  WHEN TO  CALL YOUR DOCTOR: 1. Fever over 101.0 2. Inability to urinate 3. Nausea and/or vomiting 4. Extreme swelling or bruising 5. Continued bleeding from incision. 6. Increased pain, redness, or drainage from the incision. 7. Difficulty swallowing or breathing 8. Muscle cramping or spasms. 9. Numbness or tingling in  hands or feet or around lips.  The clinic staff is available to answer your questions during regular business hours.  Please don't hesitate to call and ask to speak to one of the nurses if you have concerns.  For further questions, please visit [email or web address]   Open Splenectomy, Care After This sheet gives you information about how to care for yourself after your procedure. Your health care provider may also give you more specific instructions. If you have problems or questions, contact your health care provider. What can I expect after the procedure? After the procedure, it is common to have:  Mild abdominal pain.  Lack of energy. Follow these instructions at home: Medicines  Take over-the-counter and prescription medicines only as told by your health care provider.  Ask your health care provider if the medicine prescribed to you: ? Requires you to avoid driving or using heavy machinery. ? Can cause constipation. You may need to take actions to prevent or treat constipation, such as:  Take over-the-counter or prescription medicines.  Eat foods that are high in fiber, such as beans, whole grains, and fresh fruits and vegetables.  Limit foods that are high in fat and processed sugars, such as fried or sweet foods.  If you were prescribed an antibiotic medicine, take it as told by your health care provider. Do not stop taking the antibiotic even if you start to feel better.  Talk with your health care provider about the need for vaccinations to help prevent infections. Not having a spleen may affect your body's ability to fight infections and can make certain infections more dangerous. Incision care   Follow instructions from your health care provider about how to take care of your incision. Make sure you: ? Wash your hands with soap and water before and after you change your bandage (dressing). If soap and water are not available, use hand sanitizer. ? Change your  dressing as told by your health care provider. ? Leave stitches (sutures), skin glue, or adhesive strips in place. These skin closures may need to be in place for 2 weeks or longer. If adhesive strip edges start to loosen and curl up, you may trim the loose edges. Do not remove adhesive strips completely unless your health care provider tells you to do that.  Check your incision area every day for signs of infection. Check for: ? Redness, swelling, or pain. ? Fluid or blood. ? Warmth. ? Pus or a bad smell.  If you were sent home with a surgical drain in place, follow instructions from your health care provider about how to care for it.  Do not take baths, swim, or use a hot tub until your health care provider approves. Ask your health care provider if you may take showers. You may only be allowed to take sponge baths. Eating and drinking  Drink enough fluid to keep your urine pale yellow.  Return to your normal diet as told by your health care provider. Activity   Return to your normal activities as told by your health care provider. Ask your health care provider what activities are safe for you.  Avoid strenuous activity for 4 weeks or as told by your health care  provider.  Do not lift anything that is heavier than 10 lb (4.5 kg), or the limit that you are told, until your health care provider says that it is safe.  Rest as told by your health care provider.  Avoid sitting for a long time without moving. Get up to take short walks every 1-2 hours. This is important to improve blood flow and breathing. Ask for help if you feel weak or unsteady.  Ask your health care provider when it is safe to drive and go back to work. General instructions  Continue to practice deep breathing as told by your health care provider.  Always tell your health care providers that you do not have a spleen before you have any procedures. These include medical and dental procedures.  Keep all follow-up  visits as told by your health care provider. This is important. Contact a health care provider if:  You have pain that is not helped by medicine.  You have a fever or chills.  You have redness, swelling, or pain around your incision.  You have fluid or blood coming from your incision.  Your incision feels warm to the touch.  You have pus or a bad smell coming from your incision.  You have nausea or vomiting. Get help right away if:  Your pain gets much worse.  Your legs are red, swollen, or painful.  You have chest pain.  You have trouble breathing.  You suddenly feel very weak or dizzy. Summary  Get up to take short walks every 1-2 hours. This is important to improve blood flow and breathing.  Check your incision area every day for signs of infection, such as redness or swelling.  Talk with your health care provider about whether you need vaccinations to help prevent infections.  Do not lift anything that is heavier than 10 lb (4.5 kg), or the limit that you are told, until your health care provider says that it is safe. This information is not intended to replace advice given to you by your health care provider. Make sure you discuss any questions you have with your health care provider. Document Released: 12/01/2010 Document Revised: 06/05/2018 Document Reviewed: 06/05/2018 Elsevier Patient Education  [place].  ........Marland Kitchen   Managing Your Pain After Surgery Without Opioids    Thank you for participating in our program to help patients manage their pain after surgery without opioids. This is part of our effort to provide you with the best care possible, without exposing you or your family to the risk that opioids pose.  What pain can I expect after surgery? You can expect to have some pain after surgery. This is normal. The pain is typically worse the day after surgery, and quickly begins to get better. Many studies have found that many patients are able  to manage their pain after surgery with Over-the-Counter (OTC) medications such as Tylenol and Motrin. If you have a condition that does not allow you to take Tylenol or Motrin, notify your surgical team.  How will I manage my pain? The best strategy for controlling your pain after surgery is around the clock pain control with Tylenol (acetaminophen) and Motrin (ibuprofen or Advil). Alternating these medications with each other allows you to maximize your pain control. In addition to Tylenol and Motrin, you can use heating pads or ice packs on your incisions to help reduce your pain.  How will I alternate your regular strength over-the-counter pain medication? You will take a dose of  pain medication every three hours. ; Start by taking 650 mg of Tylenol (2 pills of 325 mg) ; 3 hours later take 600 mg of Motrin (3 pills of 200 mg) ; 3 hours after taking the Motrin take 650 mg of Tylenol ; 3 hours after that take 600 mg of Motrin.   - 1 -  See example - if your first dose of Tylenol is at 12:00 PM   12:00 PM Tylenol 650 mg (2 pills of 325 mg)  3:00 PM Motrin 600 mg (3 pills of 200 mg)  6:00 PM Tylenol 650 mg (2 pills of 325 mg)  9:00 PM Motrin 600 mg (3 pills of 200 mg)  Continue alternating every 3 hours   We recommend that you follow this schedule around-the-clock for at least 3 days after surgery, or until you feel that it is no longer needed. Use the table on the last page of this handout to keep track of the medications you are taking. Important: Do not take more than 3000mg  of Tylenol or 3200mg  of Motrin in a 24-hour period. Do not take ibuprofen/Motrin if you have a history of bleeding stomach ulcers, severe kidney disease, &/or actively taking a blood thinner  What if I still have pain? If you have pain that is not controlled with the over-the-counter pain medications (Tylenol and Motrin or Advil) you might have what we call "breakthrough" pain. You will receive a prescription for  a small amount of an opioid pain medication such as Oxycodone, Tramadol, or Tylenol with Codeine. Use these opioid pills in the first 24 hours after surgery if you have breakthrough pain. Do not take more than 1 pill every 4-6 hours.  If you still have uncontrolled pain after using all opioid pills, don't hesitate to call our staff using the number provided. We will help make sure you are managing your pain in the best way possible, and if necessary, we can provide a prescription for additional pain medication.   Day 1    Time  Name of Medication Number of pills taken  Amount of Acetaminophen  Pain Level   Comments  AM PM       AM PM       AM PM       AM PM       AM PM       AM PM       AM PM       AM PM       Total Daily amount of Acetaminophen Do not take more than  3,000 mg per day      Day 2    Time  Name of Medication Number of pills taken  Amount of Acetaminophen  Pain Level   Comments  AM PM       AM PM       AM PM       AM PM       AM PM       AM PM       AM PM       AM PM       Total Daily amount of Acetaminophen Do not take more than  3,000 mg per day      Day 3    Time  Name of Medication Number of pills taken  Amount of Acetaminophen  Pain Level   Comments  AM PM       AM PM  AM PM       AM PM          AM PM       AM PM       AM PM       AM PM       Total Daily amount of Acetaminophen Do not take more than  3,000 mg per day      Day 4    Time  Name of Medication Number of pills taken  Amount of Acetaminophen  Pain Level   Comments  AM PM       AM PM       AM PM       AM PM       AM PM       AM PM       AM PM       AM PM       Total Daily amount of Acetaminophen Do not take more than  3,000 mg per day      Day 5    Time  Name of Medication Number of pills taken  Amount of Acetaminophen  Pain Level   Comments  AM PM       AM PM       AM PM       AM PM       AM PM       AM PM       AM PM       AM PM        Total Daily amount of Acetaminophen Do not take more than  3,000 mg per day       Day 6    Time  Name of Medication Number of pills taken  Amount of Acetaminophen  Pain Level  Comments  AM PM       AM PM       AM PM       AM PM       AM PM       AM PM       AM PM       AM PM       Total Daily amount of Acetaminophen Do not take more than  3,000 mg per day      Day 7    Time  Name of Medication Number of pills taken  Amount of Acetaminophen  Pain Level   Comments  AM PM       AM PM       AM PM       AM PM       AM PM       AM PM       AM PM       AM PM       Total Daily amount of Acetaminophen Do not take more than  3,000 mg per day        For additional information about how and where to safely dispose of unused opioid medications - [employer]  Disclaimer: This document contains information and/or instructional materials adapted from [place] for the typical patient with your condition. It does not replace medical advice from your health care provider because your experience may differ from that of the typical patient. Talk to your health care provider if you have any questions about this document, your condition or your treatment plan. Adapted from [place] non-weight bearing to the right lower  extremity. Follow up with orthopedics.

## 2019-08-09 NOTE — TOC Transition Note (Addendum)
Transition of Care [facility]) - CM/SW Discharge Note   Patient Details  Name: ALDON DEVENY MRN: [id number] Date of Birth: [birth year]  [facility] CM/SW Contact:  Claudie Leach, RN 08/09/2019, 10:06 AM   Clinical Narrative:    Patient to d/c home with DME and [facility] prescription assistance.  [employer] contacted to deliver DME to room.  Appropriate documentation entered.  [place] letter provided and scripts sent to [facility] participating pharmacy.    Update: per DME delivery company, patient gone when he arrived with DME.  DME will be delivered to patient's home.   Final next level of care: Home/Self Care Barriers to Discharge: No Barriers Identified      Discharge Plan and Services   Discharge Planning Services: CM Consult, [facility], [place], Medication Assistance            DME Arranged: 3-N-1, Gilford Rile, Wheelchair manual DME Agency: [employer] Date DME Agency Contacted: 08/09/19 Time DME Agency Contacted: [phone] Representative spoke with at DME Agency: Bertrum Sol

## 2019-08-09 NOTE — Care Management (Deleted)
For home use only DME standard manual wheelchair with seat cushion (Order [id number]) General Supply Date: 08/07/2019 Department: [facility] SURGICAL Ordering/Authorizing: Anthony Sar NPI: [id number]    Patient Information  Patient Name  Paul Gregory, Paul Gregory Legal Sex  Male DOB  1971-02-28 SSN  [id number]  Order Information  Order Date/Time Release Date/Time Start Date/Time End Date/Time  08/07/19 08:06 AM None 08/07/19 08:06 AM 08/07/19 08:06 AM  Order History Inpatient Date/Time Action Taken User Additional Information  08/07/19 0806 Sign Wellington Hampshire, PA-C   08/07/19 [id number] Release Instance Meuth, Blaine Hamper, PA-C (auto-released) Released [phone]  Comments  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 A cane, crutch or walker will not resolve issue with performing activities of daily living. A wheelchair will allow patient to safely perform daily activities. Patient can safely propel the wheelchair in the home or has a caregiver who can provide assistance. Length of need 6 months .

## 2019-08-10 ENCOUNTER — Encounter: Payer: Self-pay | Admitting: Emergency Medicine

## 2019-08-12 ENCOUNTER — Encounter: Payer: Self-pay | Admitting: Student

## 2019-08-12 DIAGNOSIS — F17200 Nicotine dependence, unspecified, uncomplicated: Secondary | ICD-10-CM | POA: Insufficient documentation

## 2019-08-12 DIAGNOSIS — Z720 Tobacco use: Secondary | ICD-10-CM | POA: Insufficient documentation

## 2019-08-12 DIAGNOSIS — S3609XA Other injury of spleen, initial encounter: Secondary | ICD-10-CM | POA: Insufficient documentation

## 2019-08-12 DIAGNOSIS — D735 Infarction of spleen: Secondary | ICD-10-CM | POA: Insufficient documentation

## 2019-08-12 DIAGNOSIS — S81811A Laceration without foreign body, right lower leg, initial encounter: Secondary | ICD-10-CM | POA: Insufficient documentation

## 2019-08-12 LAB — SURGICAL PATHOLOGY

## 2019-08-31 ENCOUNTER — Telehealth: Payer: Self-pay | Admitting: Oncology

## 2019-08-31 NOTE — Telephone Encounter (Signed)
A new pt appt has been scheduled for Mr. Glancy to see Dr. Benay Spice on 1/18 at 2pm. Pt has been made aware to arrive 15 minutes early.

## 2019-09-07 ENCOUNTER — Telehealth: Payer: Self-pay | Admitting: *Deleted

## 2019-09-07 ENCOUNTER — Ambulatory Visit (HOSPITAL_COMMUNITY)
Admission: RE | Admit: 2019-09-07 | Discharge: 2019-09-07 | Disposition: A | Discharge status: Home / Self Care | Payer: Medicaid Other | Source: Ambulatory Visit | Attending: Oncology | Admitting: Oncology

## 2019-09-07 ENCOUNTER — Other Ambulatory Visit: Payer: Self-pay

## 2019-09-07 ENCOUNTER — Inpatient Hospital Stay: Payer: Self-pay | Attending: Oncology | Admitting: Oncology

## 2019-09-07 VITALS — BP 147/91 | HR 98 | Temp 98.3°F | Resp 18 | Ht 67.0 in | Wt 162.3 lb

## 2019-09-07 DIAGNOSIS — I82421 Acute embolism and thrombosis of right iliac vein: Secondary | ICD-10-CM

## 2019-09-07 DIAGNOSIS — Z8042 Family history of malignant neoplasm of prostate: Secondary | ICD-10-CM

## 2019-09-07 DIAGNOSIS — Z7901 Long term (current) use of anticoagulants: Secondary | ICD-10-CM

## 2019-09-07 DIAGNOSIS — C49A3 Gastrointestinal stromal tumor of small intestine: Secondary | ICD-10-CM

## 2019-09-07 DIAGNOSIS — F17210 Nicotine dependence, cigarettes, uncomplicated: Secondary | ICD-10-CM

## 2019-09-07 DIAGNOSIS — D735 Infarction of spleen: Secondary | ICD-10-CM

## 2019-09-07 DIAGNOSIS — M7989 Other specified soft tissue disorders: Secondary | ICD-10-CM

## 2019-09-07 DIAGNOSIS — S3609XA Other injury of spleen, initial encounter: Secondary | ICD-10-CM

## 2019-09-07 DIAGNOSIS — F1020 Alcohol dependence, uncomplicated: Secondary | ICD-10-CM

## 2019-09-07 DIAGNOSIS — M79661 Pain in right lower leg: Secondary | ICD-10-CM

## 2019-09-07 MED ORDER — RIVAROXABAN (XARELTO) VTE STARTER PACK (15 & 20 MG): ORAL_TABLET | ORAL | 0 refills | Status: DC

## 2019-09-07 MED FILL — XARELTO STARTER PACK: 15 & 20 | 30 days supply | Qty: 51 | Fill #0

## 2019-09-07 NOTE — Telephone Encounter (Signed)
IN office seeing Dr. Benay Spice as new patient. Message to CSW to determine if anyone discussed this issue with him in hospital. 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 Surgery was treatment.

## 2019-09-07 NOTE — Progress Notes (Signed)
Right lower extremity venous duplex has been completed. Preliminary results can be found in CV Proc through chart review.  Results were given to Manuela Schwartz at Dr. Gearldine Shown office.  09/07/19 3:40 PM Paul Gregory RVT

## 2019-09-07 NOTE — Patient Instructions (Signed)
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 Directions to [place] provided to pick up starter pack.
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
## 2019-09-08 ENCOUNTER — Encounter: Payer: Self-pay | Admitting: Oncology

## 2019-09-08 ENCOUNTER — Encounter: Payer: Self-pay | Admitting: *Deleted

## 2019-09-08 NOTE — Progress Notes (Signed)
[facility] Work  Clinical Social Work received referral from [employer] requesting follow up insurance options for patient.  CSW confirmed that patient was screened for Medicaid by inpatient financial counselor.  Financial counselors note states patient was screened, and it was determined that patient is not pregnant, has no dependents, and is not aged, blind, nor has a 12 month length of disability. No Medicaid referral made at this time.  Per inpatient documentation, patient was enrolled in the [facility] program and referred to the indigent health clinic.    Johnnye Lana, MSW, LCSW, OSW-C Clinical Social Worker [facility] [phone]

## 2019-09-08 NOTE — Progress Notes (Signed)
Received starter pack of Xarelto from [facility]. Sent message to financial advocates, Phillis Knack to follow up with drug company for continued supply that will be needed in 4 weeks.

## 2019-09-08 NOTE — Progress Notes (Signed)
Left application for Paul Gregory and Paul Gregory for assistance for Xarelto for [employer]. Provider and patient must complete, then returned to [employer].If approved , medication will be sent directly to patient.

## 2019-09-10 ENCOUNTER — Telehealth: Payer: Self-pay | Admitting: *Deleted

## 2019-09-10 ENCOUNTER — Telehealth: Payer: Self-pay | Admitting: Oncology

## 2019-09-10 NOTE — Telephone Encounter (Signed)
Called to inform patient we have the application he needs to complete to obtain free Xarelto from [employer]. They are not able to drive to [facility] to pick up forms. Apolonio Schneiders provided a fax # for a friend who has a business with fax machine [phone]). Faxed forms on 09/11/19 to above #. Suggested they complete and provide the financial information needed and can fax to company or back to MD office.

## 2019-09-10 NOTE — Telephone Encounter (Signed)
Scheduled per los. Called and left msg. Mailed printout  °

## 2019-09-18 ENCOUNTER — Encounter: Payer: Self-pay | Admitting: *Deleted

## 2019-09-18 NOTE — Progress Notes (Unsigned)
Received notification from [name] that he has been approved for Xarelto from company until 01/15/20 and it will be shipped to patient from company. To continue past this, he will need to apply for Medicaid and if denied to provide proof of the denial. Will provide this written information when comes in to be seen next week.

## 2019-09-23 ENCOUNTER — Encounter: Payer: Self-pay | Admitting: *Deleted

## 2019-09-28 ENCOUNTER — Ambulatory Visit: Payer: Self-pay | Admitting: Nurse Practitioner

## 2019-09-29 ENCOUNTER — Encounter: Payer: Self-pay | Admitting: Nurse Practitioner

## 2019-09-29 ENCOUNTER — Inpatient Hospital Stay: Payer: Medicaid Other | Attending: Oncology | Admitting: Nurse Practitioner

## 2019-09-29 ENCOUNTER — Other Ambulatory Visit: Payer: Self-pay

## 2019-09-29 VITALS — BP 142/94 | HR 93 | Temp 98.7°F | Resp 17 | Ht 67.0 in | Wt 161.6 lb

## 2019-09-29 DIAGNOSIS — M7989 Other specified soft tissue disorders: Secondary | ICD-10-CM | POA: Insufficient documentation

## 2019-09-29 DIAGNOSIS — Z7901 Long term (current) use of anticoagulants: Secondary | ICD-10-CM | POA: Insufficient documentation

## 2019-09-29 DIAGNOSIS — C49A3 Gastrointestinal stromal tumor of small intestine: Secondary | ICD-10-CM | POA: Insufficient documentation

## 2019-09-29 DIAGNOSIS — Z86718 Personal history of other venous thrombosis and embolism: Secondary | ICD-10-CM | POA: Insufficient documentation

## 2019-09-29 DIAGNOSIS — Z23 Encounter for immunization: Secondary | ICD-10-CM | POA: Diagnosis present
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
## 2019-09-30 ENCOUNTER — Telehealth: Payer: Self-pay | Admitting: Oncology

## 2019-09-30 NOTE — Telephone Encounter (Signed)
Scheduled per los. Called and left msg. Mailed printout  °

## 2019-10-07 ENCOUNTER — Telehealth: Payer: Self-pay | Admitting: *Deleted

## 2019-10-07 NOTE — Telephone Encounter (Signed)
Left VM for Paul Gregory to call with potential days/times he would like to get started on his post-splenectomy vaccines. 1st one due 2 months after surgery.

## 2019-10-09 ENCOUNTER — Telehealth: Payer: Self-pay | Admitting: *Deleted

## 2019-10-09 ENCOUNTER — Telehealth: Payer: Self-pay | Admitting: Oncology

## 2019-10-09 MED ORDER — RIVAROXABAN 20 MG PO TABS: 20.0000 mg | ORAL_TABLET | Freq: Every day | ORAL | 0 refills | Status: DC

## 2019-10-09 NOTE — Telephone Encounter (Signed)
Scheduled appt per 2/19 sch message - pt is aware of appt date and time  

## 2019-10-09 NOTE — Telephone Encounter (Signed)
Patient only has #1 pill of Xarelto on hand. Asking what to do? Also asking for his vaccine appointment to be on 2/22 or 2/25 at mid-morning.  Scheduling message sent for vaccine appointment. Sent script to his [facility] with the ID numbers from his [facility] and [id number]. Rushie Nyhan to call pharmacy later today and f/u on status. Confirmed they did get the pharmacy card in the mail from [facility], but was unsure how to use it.

## 2019-10-12 ENCOUNTER — Inpatient Hospital Stay: Payer: Medicaid Other

## 2019-10-12 ENCOUNTER — Other Ambulatory Visit: Payer: Self-pay

## 2019-10-12 VITALS — BP 142/88 | HR 88 | Temp 98.2°F | Resp 18

## 2019-10-12 DIAGNOSIS — C49A3 Gastrointestinal stromal tumor of small intestine: Secondary | ICD-10-CM | POA: Diagnosis not present

## 2019-10-12 DIAGNOSIS — S3609XA Other injury of spleen, initial encounter: Secondary | ICD-10-CM

## 2019-10-12 DIAGNOSIS — D735 Infarction of spleen: Secondary | ICD-10-CM

## 2019-10-12 MED ORDER — PNEUMOCOCCAL VAC POLYVALENT 25 MCG/0.5ML IJ INJ
0.5000 mL | INJECTION | Freq: Once | INTRAMUSCULAR | Status: DC
  Filled 2019-10-12: qty 0.5

## 2019-10-12 MED ORDER — MENINGOCOCCAL A C Y&W-135 OLIG IM SOLR
0.5000 mL | Freq: Once | INTRAMUSCULAR | Status: DC
  Filled 2019-10-12: qty 0.5

## 2019-10-12 MED ORDER — MENINGOCOCCAL VAC B (OMV) IM SUSY
0.5000 mL | PREFILLED_SYRINGE | Freq: Once | INTRAMUSCULAR | Status: DC
  Filled 2019-10-12: qty 0.5

## 2019-10-12 MED ORDER — PNEUMOCOCCAL VAC POLYVALENT 25 MCG/0.5ML IJ INJ
0.5000 mL | INJECTION | Freq: Once | INTRAMUSCULAR | Status: AC
  Administered 2019-10-12: 0.5 mL via INTRAMUSCULAR
  Filled 2019-10-12: qty 0.5

## 2019-10-12 MED ORDER — MENINGOCOCCAL A C Y&W-135 OLIG IM SOLR
0.5000 mL | Freq: Once | INTRAMUSCULAR | Status: AC
  Administered 2019-10-12: 0.5 mL via INTRAMUSCULAR
  Filled 2019-10-12: qty 0.5

## 2019-10-12 MED ORDER — MENINGOCOCCAL VAC B (OMV) IM SUSY
0.5000 mL | PREFILLED_SYRINGE | Freq: Once | INTRAMUSCULAR | Status: AC
  Administered 2019-10-12: 0.5 mL via INTRAMUSCULAR
  Filled 2019-10-12: qty 0.5

## 2019-10-12 NOTE — Patient Instructions (Signed)
Meningococcal Group B Vaccine (4 strain) suspension for injection What is this medicine? MENINGOCOCCAL GROUP B VACCINE, RECOMBINANT (muh ning goh KOK kal vak SEEN) is a vaccine to protect from bacterial meningitis. This vaccine does not contain live bacteria. It will not cause a meningitis. This medicine may be used for other purposes; ask your health care provider or pharmacist if you have questions. COMMON BRAND NAME(S): TRUMENBA, Bexsero  What should I tell my health care provider before I take this medicine? They need to know if you have any of these conditions:  bleeding disorder  fever or infection  immune system problems  an unusual or allergic reaction to meningococcal vaccine, other medicines, foods, dyes, or preservatives  pregnant or trying to get pregnant  breast-feeding How should I use this medicine? This medicine is for injection into a muscle. It is given by a health care professional in a hospital or clinic setting. A copy of Vaccine Information Statements will be given before each vaccination. Read this sheet carefully each time. The sheet may change frequently. Talk to your pediatrician regarding the use of this medicine in children. While this drug may be prescribed for children as young as 55 years of age for selected conditions, precautions do apply. Overdosage: If you think you have taken too much of this medicine contact a poison control center or emergency room at once. NOTE: This medicine is only for you. Do not share this medicine with others. What if I miss a dose? It is important not to miss your dose. Call your doctor or health care professional if you are unable to keep an appointment. What may interact with this medicine?  certain medicines that treat or prevent blood clots  medicines that lower your chance of fighting infection  other vaccines This list may not describe all possible interactions. Give your health care provider a list of all the  medicines, herbs, non-prescription drugs, or dietary supplements you use. Also tell them if you smoke, drink alcohol, or use illegal drugs. Some items may interact with your medicine. What should I watch for while using this medicine? Report any side effects that are worrisome to your doctor right away. This vaccine may not protect from all meningitis infections. What side effects may I notice from receiving this medicine? Side effects that you should report to your doctor or health care professional as soon as possible:  allergic reactions like skin rash, itching or hives, swelling of the face, lips, or tongue  breathing problems Side effects that usually do not require medical attention (report to your doctor or health care professional if they continue or are bothersome):  chills  diarrhea  fever  headache  joint pain  muscle pain  pain, redness, or irritation at site where injected This list may not describe all possible side effects. Call your doctor for medical advice about side effects. You may report side effects to FDA at 1-800-FDA-1088. Where should I keep my medicine? This vaccine is given in a hospital or clinic and will not be stored at home. NOTE: This sheet is a summary. It may not cover all possible information. If you have questions about this medicine, talk to your doctor, pharmacist, or health care provider.  2020 Elsevier/Gold Standard (2018-06-23 10:25:45) Pneumococcal Vaccine, Polyvalent solution for injection What is this medicine? PNEUMOCOCCAL VACCINE, POLYVALENT (NEU mo KOK al vak SEEN, pol ee VEY luhnt) is a vaccine to prevent pneumococcus bacteria infection. These bacteria are a major cause of ear infections, Strep  throat infections, and serious pneumonia, meningitis, or blood infections worldwide. These vaccines help the body to produce antibodies (protective substances) that help your body defend against these bacteria. This vaccine is recommended for  people 100 years of age and older with health problems. It is also recommended for all adults over 73 years old. This vaccine will not treat an infection. This medicine may be used for other purposes; ask your health care provider or pharmacist if you have questions. COMMON BRAND NAME(S): Pneumovax 23 What should I tell my health care provider before I take this medicine? They need to know if you have any of these conditions:  bleeding problems  bone marrow or organ transplant  cancer, Hodgkin's disease  fever  infection  immune system problems  low platelet count in the blood  seizures  an unusual or allergic reaction to pneumococcal vaccine, diphtheria toxoid, other vaccines, latex, other medicines, foods, dyes, or preservatives  pregnant or trying to get pregnant  breast-feeding How should I use this medicine? This vaccine is for injection into a muscle or under the skin. It is given by a health care professional. A copy of Vaccine Information Statements will be given before each vaccination. Read this sheet carefully each time. The sheet may change frequently. Talk to your pediatrician regarding the use of this medicine in children. While this drug may be prescribed for children as young as 29 years of age for selected conditions, precautions do apply. Overdosage: If you think you have taken too much of this medicine contact a poison control center or emergency room at once. NOTE: This medicine is only for you. Do not share this medicine with others. What if I miss a dose? It is important not to miss your dose. Call your doctor or health care professional if you are unable to keep an appointment. What may interact with this medicine?  medicines for cancer chemotherapy  medicines that suppress your immune function  medicines that treat or prevent blood clots like warfarin, enoxaparin, and dalteparin  steroid medicines like prednisone or cortisone This list may not describe  all possible interactions. Give your health care provider a list of all the medicines, herbs, non-prescription drugs, or dietary supplements you use. Also tell them if you smoke, drink alcohol, or use illegal drugs. Some items may interact with your medicine. What should I watch for while using this medicine? Mild fever and pain should go away in 3 days or less. Report any unusual symptoms to your doctor or health care professional. What side effects may I notice from receiving this medicine? Side effects that you should report to your doctor or health care professional as soon as possible:  allergic reactions like skin rash, itching or hives, swelling of the face, lips, or tongue  breathing problems  confused  fever over 102 degrees F  pain, tingling, numbness in the hands or feet  seizures  unusual bleeding or bruising  unusual muscle weakness Side effects that usually do not require medical attention (report to your doctor or health care professional if they continue or are bothersome):  aches and pains  diarrhea  fever of 102 degrees F or less  headache  irritable  loss of appetite  pain, tender at site where injected  trouble sleeping This list may not describe all possible side effects. Call your doctor for medical advice about side effects. You may report side effects to FDA at 1-800-FDA-1088. Where should I keep my medicine? This does not apply. This  vaccine is given in a clinic, pharmacy, doctor's office, or other health care setting and will not be stored at home. NOTE: This sheet is a summary. It may not cover all possible information. If you have questions about this medicine, talk to your doctor, pharmacist, or health care provider.  2020 Elsevier/Gold Standard (2008-03-12 14:32:37) Meningococcal Diphtheria Toxoid Conjugate Vaccine What is this medicine? MENINGOCOCCAL DIPHTHERIA TOXOID CONJUGATE VACCINE (muh ning goh KOK kal dif THEER ee uh TOK soid KON juh  geyt vak SEEN) is a vaccine to protect from bacterial meningitis. This vaccine does not contain live bacteria. It will not cause a meningitis. This medicine may be used for other purposes; ask your health care provider or pharmacist if you have questions. COMMON BRAND NAME(S): Menactra, Menveo What should I tell my health care provider before I take this medicine? They need to know if you have any of these conditions:  bleeding disorder  fever or infection  history of Guillain-Barre syndrome  immune system problems  an unusual or allergic reaction to diphtheria toxoid, meningococcal vaccine, latex, other medicines, foods, dyes, or preservatives  pregnant or trying to get pregnant  breast-feeding How should I use this medicine? This medicine is for injection into a muscle. It is given by a health care professional in a hospital or clinic setting. A copy of Vaccine Information Statements will be given before each vaccination. Read this sheet carefully each time. The sheet may change frequently. Talk to your pediatrician regarding the use of this medicine in children. While some brands of this drug may be prescribed for children as young as 18 months of age for selected conditions, precautions do apply. Overdosage: If you think you have taken too much of this medicine contact a poison control center or emergency room at once. NOTE: This medicine is only for you. Do not share this medicine with others. What if I miss a dose? This does not apply. What may interact with this medicine?  adalimumab  anakinra  infliximab  medicines for organ transplant  medicines to treat cancer  medicines used during some procedures to diagnose a medical condition  other vaccines  some medicines for arthritis  steroid medicines like prednisone or cortisone This list may not describe all possible interactions. Give your health care provider a list of all the medicines, herbs, non-prescription  drugs, or dietary supplements you use. Also tell them if you smoke, drink alcohol, or use illegal drugs. Some items may interact with your medicine. What should I watch for while using this medicine? Report any side effects that are worrisome to your doctor right away. Call your doctor if you have any unusual symptoms within 6 weeks of getting this vaccine. This vaccine may not protect from all meningitis infections. Women should inform their doctor if they wish to become pregnant or think they might be pregnant. Talk to your health care professional or pharmacist for more information. What side effects may I notice from receiving this medicine? Side effects that you should report to your doctor or health care professional as soon as possible:  allergic reactions like skin rash, itching or hives, swelling of the face, lips, or tongue  breathing problems  feeling faint or lightheaded, falls  fever over 102 degrees F  muscle weakness  unusual drooping or paralysis of face Side effects that usually do not require medical attention (report to your doctor or health care professional if they continue or are bothersome):  chills  diarrhea  headache  loss  of appetite  muscle aches and pains  pain at site where injected  tired This list may not describe all possible side effects. Call your doctor for medical advice about side effects. You may report side effects to FDA at 1-800-FDA-1088. Where should I keep my medicine? This drug is given in a hospital or clinic and will not be stored at home. NOTE: This sheet is a summary. It may not cover all possible information. If you have questions about this medicine, talk to your doctor, pharmacist, or health care provider.  2020 Elsevier/Gold Standard (2009-12-27 21:41:10)

## 2019-12-21 ENCOUNTER — Other Ambulatory Visit: Payer: Self-pay

## 2019-12-21 ENCOUNTER — Inpatient Hospital Stay: Payer: Medicaid Other | Attending: Oncology | Admitting: Oncology

## 2019-12-21 DIAGNOSIS — C49A3 Gastrointestinal stromal tumor of small intestine: Secondary | ICD-10-CM | POA: Insufficient documentation

## 2019-12-21 DIAGNOSIS — Z23 Encounter for immunization: Secondary | ICD-10-CM | POA: Diagnosis not present

## 2019-12-21 DIAGNOSIS — Z7901 Long term (current) use of anticoagulants: Secondary | ICD-10-CM | POA: Insufficient documentation

## 2019-12-21 DIAGNOSIS — M7989 Other specified soft tissue disorders: Secondary | ICD-10-CM | POA: Insufficient documentation

## 2019-12-21 DIAGNOSIS — Z86718 Personal history of other venous thrombosis and embolism: Secondary | ICD-10-CM | POA: Insufficient documentation

## 2019-12-21 DIAGNOSIS — D735 Infarction of spleen: Secondary | ICD-10-CM

## 2019-12-21 DIAGNOSIS — S3609XA Other injury of spleen, initial encounter: Secondary | ICD-10-CM

## 2019-12-21 MED ORDER — RIVAROXABAN 20 MG PO TABS: 20.0000 mg | ORAL_TABLET | Freq: Every day | ORAL | 1 refills | Status: DC

## 2019-12-21 MED ORDER — MENINGOCOCCAL VAC B (OMV) IM SUSY
0.5000 mL | PREFILLED_SYRINGE | Freq: Once | INTRAMUSCULAR | Status: AC
  Administered 2019-12-21: 0.5 mL via INTRAMUSCULAR
  Filled 2019-12-21: qty 0.5

## 2019-12-21 NOTE — Progress Notes (Signed)
Patient reports he will not take the COVID vaccine.
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
## 2019-12-22 ENCOUNTER — Telehealth: Payer: Self-pay | Admitting: Oncology

## 2019-12-22 NOTE — Telephone Encounter (Signed)
Scheduled per los. Called and spoke with rachel. Confirmed appt. Mailed printout per request

## 2020-02-03 ENCOUNTER — Telehealth: Payer: Self-pay | Admitting: *Deleted

## 2020-02-03 NOTE — Telephone Encounter (Signed)
Patient only has enough Xarelto for month of June. His Wynetta Emery and [employer] card ran out 01/15/20 and he was denied Medicaid. Has hearing on 02/09/20 regarding this matter. Does MD want me to continue this med after June?

## 2020-02-03 NOTE — Telephone Encounter (Signed)
Xarelto prior authorization request received unable to be processed.  No eligibility found with several attempts with CoverMyMeds.    No medicaid application per [facility] in January 2021 post hospital discharge.   Determined insurance eligibility is through PDMI.    Connected with [place]. "We are the biller.  Plan is with Wynetta Emery and [employer].  Contact [employer] [phone] with this request."   Collaborative notified.

## 2020-02-04 ENCOUNTER — Telehealth: Payer: Self-pay | Admitting: *Deleted

## 2020-02-04 NOTE — Telephone Encounter (Addendum)
Spoke w/[employer] and they are not allowed to extend his benefit without a letter from [employer] (his hearing is on 6/22). Left message for pharmacist at [facility], Burman Nieves to determine if there is anything that can be done w/[facility] in regards to 1 month supply to complete his therapy. Does not qualify for [place] since he is not in treatment for his GIST. Call to [facility]--he already used his free month coupon when he first started the medication. Called his friend, Apolonio Schneiders and informed her of need for letter. She reports she has a medicaid denial letter at home and can fax it to [employer] and [place]. Provided her the fax/phone # for company and gave the card ID # and Group Rx# assigned to his case. She reports that the hearing on 02/09/20 is to appeal the denial decision. Informed her that MD wants him to continue med till end of July.

## 2020-02-04 NOTE — Telephone Encounter (Signed)
Per Dr. Benay Spice: Needs to continue Xarelto 20 mg daily through July. Staff message sent to financial advocate, Annamary Rummage to f/u to determine if Wynetta Emery and [employer]etta Emery will provide one more month supply of medication.

## 2020-03-03 ENCOUNTER — Telehealth: Payer: Self-pay | Admitting: *Deleted

## 2020-03-03 NOTE — Telephone Encounter (Signed)
Connected with [employer] Emery and [employer]etta Emery who handles their own prior authorizations per [place] unable to receive Xarelto to continue Xarelto 20 mg through end of July 2021 per EMR notes.  "Medication assistance expired Jan 15, 2020.  Needs to re-apply as Wynetta Emery and [employer]etta Emery did not received Medicaid denial letter before assistance expired.  Both Medicaid denial letter with a new prescription should be sent to Kai Levins fax: [phone].   Current processing time is within the next two to three business days."  02/04/2020 EMR note reads Medicaid hearing was expected 02/09/2020.  No outcome information or letter received.  Notifying provider.

## 2020-03-03 NOTE — Telephone Encounter (Signed)
Connected with Barbie Banner [phone]).  "Ammar Moffatt [place] ran out of Xarelto seven to ten days ago and I do not think he needs to continue using this medication.  His Medicaid denial letter was faxed to [facility] 2 to 3 weeks ago.  Will have to find the letter hopefully to fax in the morning to Dr. Gearldine Shown office."  Provided [phone] fax number for Apolonio Schneiders to send letter needed with prescription to apply for assistance.  Advised of provider orders to continue Xarelto with this call.

## 2020-03-03 NOTE — Telephone Encounter (Signed)
Needs indefinite anticoagulation, please let me know if he is unable to get the Xarelto

## 2020-03-04 NOTE — Telephone Encounter (Signed)
Medicaid denial letter received.  Letter provided to collaborative nurse for today in [place] [facility] notifying of patient need for Xarelto.  Message sent yesterday with further information.

## 2020-03-07 NOTE — Telephone Encounter (Signed)
Collaborative informed tis nurse today of Paul [place] needing 6 months of Xarelto completed with no further refills.    Spoke with significant other Paul Gregory providing above information.  With this call learned patient approved for family planning medicaid only.  Has appealed but would like advice on how to obtain complete medical medicaid.  Will notify [employer].

## 2020-03-30 ENCOUNTER — Other Ambulatory Visit: Payer: Self-pay | Admitting: Orthopedic Surgery

## 2020-03-30 DIAGNOSIS — S82261E Displaced segmental fracture of shaft of right tibia, subsequent encounter for open fracture type I or II with routine healing: Secondary | ICD-10-CM

## 2020-04-12 ENCOUNTER — Ambulatory Visit
Admission: RE | Admit: 2020-04-12 | Discharge: 2020-04-12 | Disposition: A | Discharge status: Home / Self Care | Payer: Medicaid Other | Source: Ambulatory Visit | Attending: Orthopedic Surgery | Admitting: Orthopedic Surgery

## 2020-04-12 ENCOUNTER — Other Ambulatory Visit: Payer: Self-pay

## 2020-04-12 DIAGNOSIS — S82261E Displaced segmental fracture of shaft of right tibia, subsequent encounter for open fracture type I or II with routine healing: Secondary | ICD-10-CM | POA: Diagnosis not present

## 2020-06-23 ENCOUNTER — Inpatient Hospital Stay: Payer: No Typology Code available for payment source | Attending: Oncology | Admitting: Oncology

## 2020-06-27 ENCOUNTER — Encounter: Payer: Self-pay | Admitting: *Deleted

## 2020-06-27 NOTE — Progress Notes (Signed)
No show for 6 month f/u on 06/23/20. Scheduling message sent to get him in in the next month.

## 2020-06-28 ENCOUNTER — Telehealth: Payer: Self-pay | Admitting: Oncology

## 2020-06-28 NOTE — Telephone Encounter (Signed)
Called pt per 11/8 sch msg - no answer . Left message for patient to call back to reschedule missed appt.

## 2021-01-20 IMAGING — DX DG ABD PORTABLE 1V
1 series · 1 of 1 positions shown · non-contrast
Comparison: CT or earlier in the same day

CLINICAL DATA: NG tube placement

EXAM:
PORTABLE ABDOMEN - 1 VIEW

[abdomen kub]
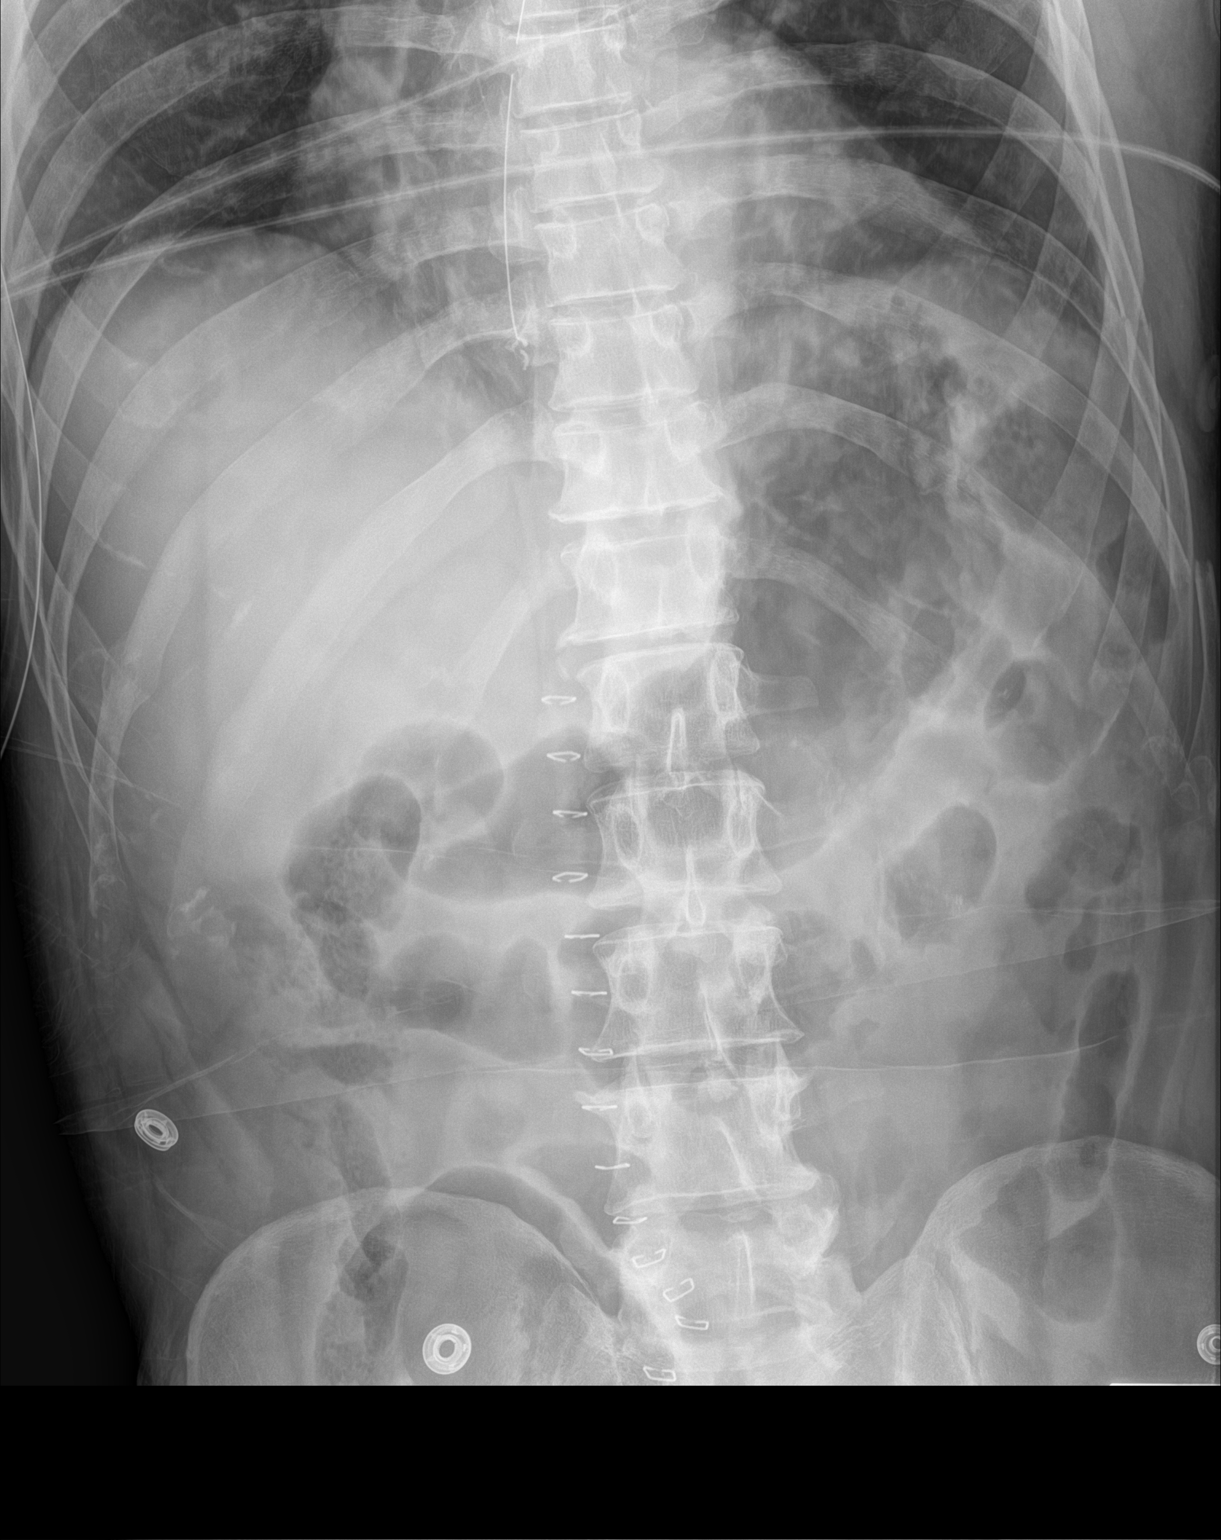

[1 of 1 positions shown; findings below may reference images not displayed]

FINDINGS: The NG tube tip terminates near the GE junction. Repositioning is
recommended. Surgical staples project over the patient's midline
abdomen. A likely pockets of pneumoperitoneum related to recent
surgical intervention. Left-sided rib fractures are noted. The bowel
gas pattern is nonobstructive.
IMPRESSION: 1. Enteric tube tip projects near the GE. Repositioning is
recommended.
2. Status post laparotomy. There is pneumoperitoneum likely related
to the recent surgical intervention.
3. Multiple displaced left-sided rib fractures are noted.

These results will be called to the ordering clinician or
representative by the Radiologist Assistant, and communication
documented in the PACS or zVision Dashboard.
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
## 2022-06-28 ENCOUNTER — Ambulatory Visit (INDEPENDENT_AMBULATORY_CARE_PROVIDER_SITE_OTHER): Payer: Medicare Other | Admitting: Nurse Practitioner

## 2022-06-28 ENCOUNTER — Encounter: Payer: Self-pay | Admitting: Nurse Practitioner

## 2022-06-28 VITALS — BP 157/99 | HR 106 | Temp 97.6°F | Ht 68.0 in | Wt 167.2 lb

## 2022-06-28 DIAGNOSIS — Z1211 Encounter for screening for malignant neoplasm of colon: Secondary | ICD-10-CM | POA: Diagnosis not present

## 2022-06-28 DIAGNOSIS — F17200 Nicotine dependence, unspecified, uncomplicated: Secondary | ICD-10-CM | POA: Diagnosis not present

## 2022-06-28 DIAGNOSIS — R319 Hematuria, unspecified: Secondary | ICD-10-CM | POA: Diagnosis not present

## 2022-06-28 DIAGNOSIS — R8290 Unspecified abnormal findings in urine: Secondary | ICD-10-CM | POA: Diagnosis not present

## 2022-06-28 DIAGNOSIS — Z1329 Encounter for screening for other suspected endocrine disorder: Secondary | ICD-10-CM

## 2022-06-28 DIAGNOSIS — E663 Overweight: Secondary | ICD-10-CM

## 2022-06-28 DIAGNOSIS — R5382 Chronic fatigue, unspecified: Secondary | ICD-10-CM

## 2022-06-28 DIAGNOSIS — R030 Elevated blood-pressure reading, without diagnosis of hypertension: Secondary | ICD-10-CM

## 2022-06-28 LAB — POCT URINALYSIS DIPSTICK
Bilirubin, UA: NEGATIVE
Blood, UA: 1
Glucose, UA: NEGATIVE
Ketones, UA: NEGATIVE
Leukocytes, UA: NEGATIVE
Nitrite, UA: NEGATIVE
Protein, UA: POSITIVE — AB
Spec Grav, UA: 1.01 (ref 1.010–1.025)
Urobilinogen, UA: 0.2 E.U./dL
pH, UA: 7 (ref 5.0–8.0)

## 2022-06-28 NOTE — Patient Instructions (Signed)
Smoking cessation  
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
## 2022-06-29 ENCOUNTER — Telehealth: Payer: Self-pay

## 2022-06-29 ENCOUNTER — Other Ambulatory Visit: Payer: Self-pay

## 2022-06-29 ENCOUNTER — Ambulatory Visit: Payer: Medicare Other

## 2022-06-29 DIAGNOSIS — Z1211 Encounter for screening for malignant neoplasm of colon: Secondary | ICD-10-CM

## 2022-06-29 MED ORDER — NA SULFATE-K SULFATE-MG SULF 17.5-3.13-1.6 GM/177ML PO SOLN: 1.0000 | Freq: Once | ORAL | 0 refills | Status: AC

## 2022-06-29 NOTE — Telephone Encounter (Signed)
Gastroenterology Pre-Procedure Review  Request Date: 07/20/22 Requesting Physician: Dr. Allen Norris  PATIENT REVIEW QUESTIONS: The patient responded to the following health history questions as indicated:    1. Are you having any GI issues? no 2. Do you have a personal history of Polyps? no 3. Do you have a family history of Colon Cancer or Polyps? yes (dad may have had colon cancer unsure) 4. Diabetes Mellitus? no 5. Joint replacements in the past 12 months?no 6. Major health problems in the past 3 months?no 7. Any artificial heart valves, MVP, or defibrillator?no    MEDICATIONS & ALLERGIES:    Patient reports the following regarding taking any anticoagulation/antiplatelet therapy:   Plavix, Coumadin, Eliquis, Xarelto, Lovenox, Pradaxa, Brilinta, or Effient? no Aspirin? no  Patient confirms/reports the following medications:  Current Outpatient Medications  Medication Sig Dispense Refill   ibuprofen (ADVIL) 200 MG tablet Take 400 mg by mouth every 6 (six) hours as needed for moderate pain. (Patient not taking: Reported on 06/28/2022)     No current facility-administered medications for this visit.    Patient confirms/reports the following allergies:  No Known Allergies  No orders of the defined types were placed in this encounter.   AUTHORIZATION INFORMATION Primary Insurance: 1D#: Group #:  Secondary Insurance: 1D#: Group #:  SCHEDULE INFORMATION: Date: 07/20/22 Time: Location: [facility]

## 2022-06-30 LAB — COMPLETE METABOLIC PANEL WITH GFR
AG Ratio: 2.2 (calc) (ref 1.0–2.5)
ALT: 51 U/L — ABNORMAL HIGH (ref 9–46)
AST: 43 U/L — ABNORMAL HIGH (ref 10–35)
Albumin: 4.8 g/dL (ref 3.6–5.1)
Alkaline phosphatase (APISO): 59 U/L (ref 35–144)
BUN: 8 mg/dL (ref 7–25)
CO2: 28 mmol/L (ref 20–32)
Calcium: 9.4 mg/dL (ref 8.6–10.3)
Chloride: 101 mmol/L (ref 98–110)
Creat: 0.72 mg/dL (ref 0.70–1.30)
Globulin: 2.2 g/dL (calc) (ref 1.9–3.7)
Glucose, Bld: 93 mg/dL (ref 65–99)
Potassium: 4.6 mmol/L (ref 3.5–5.3)
Sodium: 137 mmol/L (ref 135–146)
Total Bilirubin: 0.7 mg/dL (ref 0.2–1.2)
Total Protein: 7 g/dL (ref 6.1–8.1)
eGFR: 111 mL/min/{1.73_m2} (ref >=60)

## 2022-06-30 LAB — CBC WITH DIFFERENTIAL/PLATELET
Absolute Monocytes: 1154 cells/uL — ABNORMAL HIGH (ref 200–950)
Basophils Absolute: 104 cells/uL (ref 0–200)
Basophils Relative: 1 %
Eosinophils Absolute: 218 cells/uL (ref 15–500)
Eosinophils Relative: 2.1 %
HCT: 45.5 % (ref 38.5–50.0)
Hemoglobin: 16.5 g/dL (ref 13.2–17.1)
Lymphs Abs: 2465 cells/uL (ref 850–3900)
MCH: 35.4 pg — ABNORMAL HIGH (ref 27.0–33.0)
MCHC: 36.3 g/dL — ABNORMAL HIGH (ref 32.0–36.0)
MCV: 97.6 fL (ref 80.0–100.0)
MPV: 9.8 fL (ref 7.5–12.5)
Monocytes Relative: 11.1 %
Neutro Abs: 6458 cells/uL (ref 1500–7800)
Neutrophils Relative %: 62.1 %
Platelets: 299 10*3/uL (ref 140–400)
RBC: 4.66 10*6/uL (ref 4.20–5.80)
RDW: 12.3 % (ref 11.0–15.0)
Total Lymphocyte: 23.7 %
WBC: 10.4 10*3/uL (ref 3.8–10.8)

## 2022-06-30 LAB — LIPID PANEL
Cholesterol: 176 mg/dL (ref <200)
HDL: 79 mg/dL (ref >=40)
LDL Cholesterol (Calc): 84 mg/dL (calc)
Non-HDL Cholesterol (Calc): 97 mg/dL (calc) (ref <130)
Total CHOL/HDL Ratio: 2.2 (calc) (ref <5.0)
Triglycerides: 47 mg/dL (ref <150)

## 2022-06-30 LAB — TSH: TSH: 2.36 mIU/L (ref 0.40–4.50)

## 2022-07-09 ENCOUNTER — Telehealth: Payer: Self-pay

## 2022-07-09 NOTE — Telephone Encounter (Signed)
Patient has requested to cancel his colonoscopy due to his friend will be out of town.  He will call back to reschedule at a later time.  Kim at [facility] has been notified of cancellation.  Thanks,  Sharyn Lull CMA

## 2022-07-13 DIAGNOSIS — R319 Hematuria, unspecified: Secondary | ICD-10-CM | POA: Insufficient documentation

## 2022-07-13 DIAGNOSIS — E663 Overweight: Secondary | ICD-10-CM | POA: Insufficient documentation

## 2022-07-13 NOTE — Assessment & Plan Note (Addendum)
Urinalysis showed microscopic blood Would refer him to urology

## 2022-07-18 ENCOUNTER — Encounter: Payer: Self-pay | Admitting: Nurse Practitioner

## 2022-07-18 DIAGNOSIS — R030 Elevated blood-pressure reading, without diagnosis of hypertension: Secondary | ICD-10-CM | POA: Insufficient documentation

## 2022-07-18 NOTE — Assessment & Plan Note (Addendum)
Smoking cessation was discussed, 5-7 minutes was spent of this topic specifically.  Supplemental information provided.

## 2022-07-18 NOTE — Assessment & Plan Note (Signed)
His blood pressure 157/99 in the office today. Encourage patient to cut down on fast food and consume low-salt diet. Encourage patient to check blood pressure out of office setting and bring the readings to next office visit. We will continue to monitor if needed would start him on medication.

## 2022-07-20 ENCOUNTER — Ambulatory Visit: Admit: 2022-07-20 | Payer: No Typology Code available for payment source | Admitting: Gastroenterology

## 2022-07-20 SURGERY — COLONOSCOPY WITH PROPOFOL
Anesthesia: General

## 2022-07-27 ENCOUNTER — Ambulatory Visit (INDEPENDENT_AMBULATORY_CARE_PROVIDER_SITE_OTHER): Payer: Medicare Other | Admitting: Urology

## 2022-07-27 ENCOUNTER — Ambulatory Visit (INDEPENDENT_AMBULATORY_CARE_PROVIDER_SITE_OTHER): Payer: Medicare Other | Admitting: Nurse Practitioner

## 2022-07-27 ENCOUNTER — Encounter: Payer: Self-pay | Admitting: Urology

## 2022-07-27 ENCOUNTER — Encounter: Payer: Self-pay | Admitting: Nurse Practitioner

## 2022-07-27 VITALS — BP 138/70 | HR 91 | Wt 170.7 lb

## 2022-07-27 VITALS — BP 177/114 | HR 71 | Ht 67.0 in | Wt 170.0 lb

## 2022-07-27 DIAGNOSIS — N529 Male erectile dysfunction, unspecified: Secondary | ICD-10-CM

## 2022-07-27 DIAGNOSIS — Z716 Tobacco abuse counseling: Secondary | ICD-10-CM | POA: Diagnosis not present

## 2022-07-27 DIAGNOSIS — Z1211 Encounter for screening for malignant neoplasm of colon: Secondary | ICD-10-CM

## 2022-07-27 DIAGNOSIS — R030 Elevated blood-pressure reading, without diagnosis of hypertension: Secondary | ICD-10-CM

## 2022-07-27 DIAGNOSIS — F1010 Alcohol abuse, uncomplicated: Secondary | ICD-10-CM | POA: Insufficient documentation

## 2022-07-27 DIAGNOSIS — R3129 Other microscopic hematuria: Secondary | ICD-10-CM

## 2022-07-27 LAB — URINALYSIS, COMPLETE
Bilirubin, UA: NEGATIVE
Glucose, UA: NEGATIVE
Ketones, UA: NEGATIVE
Leukocytes,UA: NEGATIVE
Nitrite, UA: NEGATIVE
Protein,UA: NEGATIVE
Specific Gravity, UA: 1.015 (ref 1.005–1.030)
Urobilinogen, Ur: 0.2 mg/dL (ref 0.2–1.0)
pH, UA: 7.5 (ref 5.0–7.5)

## 2022-07-27 LAB — MICROSCOPIC EXAMINATION

## 2022-07-27 MED ORDER — TADALAFIL 20 MG PO TABS: 20.0000 mg | ORAL_TABLET | Freq: Every day | ORAL | 0 refills | Status: DC | PRN

## 2022-07-27 NOTE — Progress Notes (Unsigned)
07/27/2022 10:15 AM   [name] 11-Mar-1971 [id number]  Referring provider: Theresia Lo, NP [place],  [place] [address]  Chief Complaint  Patient presents with   Hematuria    HPI: Paul Gregory is a 51 y.o. male referred for evaluation of hematuria.  Recent PCP visit with dipstick positive blood on UA; no microscopy ordered Denies gross hematuria No bothersome LUTS Denies flank, abdominal or pelvic pain Occasionally notes increased odor to the urine but thinks this is due to not drinking enough fluids Current smoker and states he has smoked 1 pack/day for the last 20 years  Also complains of difficulty maintaining an erection No pain/curvature Organic risk factors-tobacco use  PMH: No past medical history on file.  Surgical History: Past Surgical History:  Procedure Laterality Date   APPLICATION OF WOUND VAC Right 08/05/2019   Procedure: Application Of Wound Vac;  Surgeon: Shona Needles, MD;  Location: [facility];  Service: Orthopedics;  Laterality: Right;   I & D EXTREMITY Right 08/05/2019   Procedure: IRRIGATION AND DEBRIDEMENT RIGHT LEG;  Surgeon: Shona Needles, MD;  Location: [facility];  Service: Orthopedics;  Laterality: Right;   LAPAROTOMY N/A 08/04/2019   Procedure: EXPLORATORY LAPAROTOMY, splenectomy, small bowel resection;  Surgeon: Clovis Riley, MD;  Location: [facility];  Service: General;  Laterality: N/A;   SPLENECTOMY, TOTAL     TIBIA IM NAIL INSERTION Right 08/05/2019   Procedure: INTRAMEDULLARY (IM) NAIL TIBIAL;  Surgeon: Shona Needles, MD;  Location: [facility];  Service: Orthopedics;  Laterality: Right;    Home Medications:  Allergies as of 07/27/2022   No Known Allergies      Medication List        Accurate as of July 27, 2022 10:15 AM. If you have any questions, ask your nurse or doctor.          ibuprofen 200 MG tablet Commonly known as: ADVIL Take 400 mg by mouth every 6 (six) hours as needed for moderate pain.         Allergies: No Known Allergies  Family History: Family History  Problem Relation Age of Onset   Pancreatitis Brother    Pancreatitis Daughter     Social History:  reports that he has been smoking cigarettes. He has been smoking an average of 1 pack per day. He has never used smokeless tobacco. He reports current alcohol use of about 12.0 standard drinks of alcohol per week. He reports current drug use. Drug: Marijuana.   Physical Exam: BP (!) 177/114   Pulse 71   Ht '5\' 7"'$  (1.702 m)   Wt 170 lb (77.1 kg)   BMI 26.63 kg/m   Constitutional:  Alert and oriented, No acute distress. HEENT: [place] AT Respiratory: Normal respiratory effort, no increased work of breathing. Psychiatric: Normal mood and affect.  Laboratory Data:  Urinalysis Dipstick trace blood Microscopy 3-10 RBC  Assessment & Plan:    1.  Microscopic hematuria We discussed the recommend evaluation for microscopic hematuria.  Based on tobacco history would recommend CT urogram and cystoscopy.  The procedures were discussed in detail and he elects to schedule ***check to see if order entered  2.  Erectile dysfunction PDE 5 inhibitor nave Was interested and may trial a PDE 5 medication and Rx tadalafil 20 mg and pharmacy Dosing and side effects were discussed   Abbie Sons, MD  [facility] [address] [place], [place] [address] [phone]

## 2022-07-27 NOTE — Progress Notes (Unsigned)
Established Patient Office Visit  Subjective:  Patient ID: Paul Gregory, male    DOB: Mar 21, 1971  Age: 51 y.o. MRN: [id number]  CC:  Chief Complaint  Patient presents with   Hematuria    Pt notes has a urology appt later today no concerns from pt      HPI  Paul Gregory presents for one month follow up on blood pressure.  HPI   No past medical history on file.  Past Surgical History:  Procedure Laterality Date   APPLICATION OF WOUND VAC Right 08/05/2019   Procedure: Application Of Wound Vac;  Surgeon: Shona Needles, MD;  Location: [facility];  Service: Orthopedics;  Laterality: Right;   I & D EXTREMITY Right 08/05/2019   Procedure: IRRIGATION AND DEBRIDEMENT RIGHT LEG;  Surgeon: Shona Needles, MD;  Location: [facility];  Service: Orthopedics;  Laterality: Right;   LAPAROTOMY N/A 08/04/2019   Procedure: EXPLORATORY LAPAROTOMY, splenectomy, small bowel resection;  Surgeon: Clovis Riley, MD;  Location: [facility];  Service: General;  Laterality: N/A;   SPLENECTOMY, TOTAL     TIBIA IM NAIL INSERTION Right 08/05/2019   Procedure: INTRAMEDULLARY (IM) NAIL TIBIAL;  Surgeon: Shona Needles, MD;  Location: [facility];  Service: Orthopedics;  Laterality: Right;    Family History  Problem Relation Age of Onset   Pancreatitis Brother    Pancreatitis Daughter     Social History   Socioeconomic History   Marital status: Divorced    Spouse name: Not on file   Number of children: Not on file   Years of education: Not on file   Highest education level: Not on file  Occupational History   Not on file  Tobacco Use   Smoking status: Every Day    Packs/day: 1.00    Types: Cigarettes   Smokeless tobacco: Never  Vaping Use   Vaping Use: Never used  Substance and Sexual Activity   Alcohol use: Yes    Alcohol/week: 12.0 standard drinks of alcohol    Types: 12 Cans of beer per week   Drug use: Yes    Types: Marijuana   Sexual activity: Yes  Other Topics Concern   Not on file   Social History Narrative   ** Merged History Encounter **       Social Determinants of Health   Financial Resource Strain: Not on file  Food Insecurity: Not on file  Transportation Needs: Not on file  Physical Activity: Not on file  Stress: Not on file  Social Connections: Not on file  Intimate Partner Violence: Not on file     Outpatient Medications Prior to Visit  Medication Sig Dispense Refill   ibuprofen (ADVIL) 200 MG tablet Take 400 mg by mouth every 6 (six) hours as needed for moderate pain.     No facility-administered medications prior to visit.    No Known Allergies  ROS Review of Systems  Constitutional: Negative.   HENT: Negative.    Eyes: Negative.   Respiratory:  Negative for chest tightness and shortness of breath.   Cardiovascular:  Negative for chest pain and palpitations.  Gastrointestinal: Negative.   Endocrine: Negative.   Genitourinary: Negative.   Musculoskeletal: Negative.   Neurological: Negative.   Psychiatric/Behavioral: Negative.        Objective:    Physical Exam Constitutional:      Appearance: Normal appearance.  HENT:     Right Ear: Tympanic membrane normal.     Left Ear: Tympanic membrane  normal.     Mouth/Throat:     Mouth: Mucous membranes are moist.  Eyes:     Extraocular Movements: Extraocular movements intact.     Pupils: Pupils are equal, round, and reactive to light.  Cardiovascular:     Pulses: Normal pulses.     Heart sounds: Normal heart sounds.  Pulmonary:     Effort: Pulmonary effort is normal.     Breath sounds: Normal breath sounds.  Abdominal:     General: Bowel sounds are normal.     Palpations: Abdomen is soft.  Musculoskeletal:        General: Normal range of motion.  Skin:    General: Skin is warm.     Capillary Refill: Capillary refill takes less than 2 seconds.  Neurological:     General: No focal deficit present.     Mental Status: He is alert and oriented to person, place, and time. Mental  status is at baseline.  Psychiatric:        Mood and Affect: Mood normal.        Behavior: Behavior normal.        Thought Content: Thought content normal.        Judgment: Judgment normal.     BP 138/70   Pulse 91   Wt 170 lb 11.2 oz (77.4 kg)   SpO2 98%   BMI 25.95 kg/m  Wt Readings from Last 3 Encounters:  07/27/22 170 lb (77.1 kg)  07/27/22 170 lb 11.2 oz (77.4 kg)  06/28/22 167 lb 3.2 oz (75.8 kg)     Health Maintenance  Topic Date Due   COLONOSCOPY (Pts 45-61yr Insurance coverage will need to be confirmed)  Never done   [employer]ness (AWV)  08/27/2022 (Originally 91972/01/14   Zoster Vaccines- Shingrix (1 of 2) 09/28/2022 (Originally 04/29/1990)   INFLUENZA VACCINE  11/18/2022 (Originally 03/20/2022)   DTaP/Tdap/Td (3 - Td or Tdap) 02/23/2028   HIV Screening  Completed   HPV VACCINES  Aged Out   COVID-19 Vaccine  Discontinued   Hepatitis C Screening  Discontinued    There are no preventive care reminders to display for this patient.  Lab Results  Component Value Date   TSH 2.36 06/29/2022   Lab Results  Component Value Date   WBC 10.4 06/29/2022   HGB 16.5 06/29/2022   HCT 45.5 06/29/2022   MCV 97.6 06/29/2022   PLT 299 06/29/2022   Lab Results  Component Value Date   NA 137 06/29/2022   K 4.6 06/29/2022   CO2 28 06/29/2022   GLUCOSE 93 06/29/2022   BUN 8 06/29/2022   CREATININE 0.72 06/29/2022   BILITOT 0.7 06/29/2022   ALKPHOS 33 (L) 08/04/2019   AST 43 (H) 06/29/2022   ALT 51 (H) 06/29/2022   PROT 7.0 06/29/2022   ALBUMIN 3.0 (L) 08/04/2019   CALCIUM 9.4 06/29/2022   ANIONGAP 8 08/09/2019   EGFR 111 06/29/2022   Lab Results  Component Value Date   CHOL 176 06/29/2022   Lab Results  Component Value Date   HDL 79 06/29/2022   Lab Results  Component Value Date   LDLCALC 84 06/29/2022   Lab Results  Component Value Date   TRIG 47 06/29/2022   Lab Results  Component Value Date   CHOLHDL 2.2 06/29/2022   No results  found for: "HGBA1C"    Assessment & Plan:   Problem List Items Addressed This Visit       Other   Alcohol  abuse - Primary   Other Visit Diagnoses     Screening for colon cancer       Relevant Orders   Cologuard        No orders of the defined types were placed in this encounter.    Follow-up: No follow-ups on file.    Theresia Lo, NP

## 2022-07-28 ENCOUNTER — Encounter: Payer: Self-pay | Admitting: Nurse Practitioner

## 2022-07-28 DIAGNOSIS — Z716 Tobacco abuse counseling: Secondary | ICD-10-CM | POA: Insufficient documentation

## 2022-07-28 NOTE — Assessment & Plan Note (Signed)
His blood pressure 138/70 in the office today. Encourage patient to check blood pressure at home and bring the readings to next appointment. Would monitor if needed would start him on medication.

## 2022-07-28 NOTE — Assessment & Plan Note (Signed)
He drinks 12 cans of beer in a week. Encouraged patient to cut down on drinking.

## 2022-07-28 NOTE — Assessment & Plan Note (Signed)
Patient smoke a pack of cigarette daily. Smoking cessation discussed.

## 2022-08-10 ENCOUNTER — Ambulatory Visit: Payer: Medicare Other

## 2022-08-27 ENCOUNTER — Ambulatory Visit: Payer: Medicare Other | Admitting: Internal Medicine

## 2022-08-30 ENCOUNTER — Other Ambulatory Visit: Payer: Medicare Other | Admitting: Urology

## 2022-09-18 LAB — COLOGUARD: COLOGUARD: NEGATIVE

## 2023-01-10 ENCOUNTER — Ambulatory Visit (INDEPENDENT_AMBULATORY_CARE_PROVIDER_SITE_OTHER): Payer: Medicare Other | Admitting: Nurse Practitioner

## 2023-01-10 ENCOUNTER — Encounter: Payer: Self-pay | Admitting: Nurse Practitioner

## 2023-01-10 VITALS — BP 130/84 | HR 90 | Temp 97.4°F | Ht 67.0 in | Wt 166.0 lb

## 2023-01-10 DIAGNOSIS — Z131 Encounter for screening for diabetes mellitus: Secondary | ICD-10-CM | POA: Diagnosis not present

## 2023-01-10 DIAGNOSIS — Z87448 Personal history of other diseases of urinary system: Secondary | ICD-10-CM

## 2023-01-10 DIAGNOSIS — R7989 Other specified abnormal findings of blood chemistry: Secondary | ICD-10-CM

## 2023-01-10 DIAGNOSIS — F419 Anxiety disorder, unspecified: Secondary | ICD-10-CM | POA: Diagnosis not present

## 2023-01-10 DIAGNOSIS — Z716 Tobacco abuse counseling: Secondary | ICD-10-CM

## 2023-01-10 DIAGNOSIS — Z789 Other specified health status: Secondary | ICD-10-CM

## 2023-01-10 DIAGNOSIS — F17200 Nicotine dependence, unspecified, uncomplicated: Secondary | ICD-10-CM | POA: Diagnosis not present

## 2023-01-10 LAB — COMPREHENSIVE METABOLIC PANEL
ALT: 28 U/L (ref 0–53)
AST: 28 U/L (ref 0–37)
Albumin: 4.7 g/dL (ref 3.5–5.2)
Alkaline Phosphatase: 55 U/L (ref 39–117)
BUN: 9 mg/dL (ref 6–23)
CO2: 27 mEq/L (ref 19–32)
Calcium: 9.8 mg/dL (ref 8.4–10.5)
Chloride: 104 mEq/L (ref 96–112)
Creatinine, Ser: 0.77 mg/dL (ref 0.40–1.50)
GFR: 103.52 mL/min (ref >60.00)
Glucose, Bld: 96 mg/dL (ref 70–99)
Potassium: 4.8 mEq/L (ref 3.5–5.1)
Sodium: 140 mEq/L (ref 135–145)
Total Bilirubin: 0.7 mg/dL (ref 0.2–1.2)
Total Protein: 6.9 g/dL (ref 6.0–8.3)

## 2023-01-10 LAB — LIPID PANEL
Cholesterol: 172 mg/dL (ref 0–200)
HDL: 77.5 mg/dL (ref >39.00)
LDL Cholesterol: 86 mg/dL (ref 0–99)
NonHDL: 94.07
Total CHOL/HDL Ratio: 2
Triglycerides: 42 mg/dL (ref 0.0–149.0)
VLDL: 8.4 mg/dL (ref 0.0–40.0)

## 2023-01-10 LAB — CBC WITH DIFFERENTIAL/PLATELET
Basophils Absolute: 0.1 10*3/uL (ref 0.0–0.1)
Basophils Relative: 1.3 % (ref 0.0–3.0)
Eosinophils Absolute: 0.2 10*3/uL (ref 0.0–0.7)
Eosinophils Relative: 2 % (ref 0.0–5.0)
HCT: 47.1 % (ref 39.0–52.0)
Hemoglobin: 16.3 g/dL (ref 13.0–17.0)
Lymphocytes Relative: 24.4 % (ref 12.0–46.0)
Lymphs Abs: 2.2 10*3/uL (ref 0.7–4.0)
MCHC: 34.5 g/dL (ref 30.0–36.0)
MCV: 103.6 fl — ABNORMAL HIGH (ref 78.0–100.0)
Monocytes Absolute: 1.1 10*3/uL — ABNORMAL HIGH (ref 0.1–1.0)
Monocytes Relative: 12.6 % — ABNORMAL HIGH (ref 3.0–12.0)
Neutro Abs: 5.3 10*3/uL (ref 1.4–7.7)
Neutrophils Relative %: 59.7 % (ref 43.0–77.0)
Platelets: 341 10*3/uL (ref 150.0–400.0)
RBC: 4.55 Mil/uL (ref 4.22–5.81)
RDW: 13.5 % (ref 11.5–15.5)
WBC: 8.9 10*3/uL (ref 4.0–10.5)

## 2023-01-10 LAB — HEMOGLOBIN A1C: Hgb A1c MFr Bld: 5.1 % (ref 4.6–6.5)

## 2023-01-10 LAB — TSH: TSH: 1.88 u[IU]/mL (ref 0.35–5.50)

## 2023-01-10 MED ORDER — PROPRANOLOL HCL 10 MG PO TABS: 10.0000 mg | ORAL_TABLET | Freq: Three times a day (TID) | ORAL | 1 refills | Status: DC | PRN
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
## 2023-01-10 NOTE — Progress Notes (Signed)
Please inform patient:  TSH, liver and kidney function looks good. Overall blood count looks good with slight changes in monocytes and MCV not concerning.

## 2023-01-10 NOTE — Patient Instructions (Addendum)
Labs ordered. Rx propanolol sent for anxiety. Lung cancer screening ordered.

## 2023-01-19 ENCOUNTER — Encounter: Payer: Self-pay | Admitting: Nurse Practitioner

## 2023-01-19 DIAGNOSIS — Z789 Other specified health status: Secondary | ICD-10-CM | POA: Insufficient documentation

## 2023-01-19 DIAGNOSIS — Z720 Tobacco use: Secondary | ICD-10-CM | POA: Insufficient documentation

## 2023-01-19 NOTE — Assessment & Plan Note (Signed)
Smoking cessation was discussed, 5-7 minutes was spent of this topic specifically.   

## 2023-01-19 NOTE — Assessment & Plan Note (Signed)
AST 43 and ALT 51 on 06/29/2022. Will repeat liver function

## 2023-01-19 NOTE — Assessment & Plan Note (Signed)
GAD score 4. Will treat with propranolol as needed for anxiety

## 2023-01-19 NOTE — Assessment & Plan Note (Signed)
Advised patient to cut down on drinking beer beer. Patient verbalized understanding and stated no resources needed at present.

## 2023-01-30 ENCOUNTER — Other Ambulatory Visit: Payer: Self-pay | Admitting: Nurse Practitioner

## 2023-01-30 DIAGNOSIS — F419 Anxiety disorder, unspecified: Secondary | ICD-10-CM

## 2023-04-08 ENCOUNTER — Ambulatory Visit (INDEPENDENT_AMBULATORY_CARE_PROVIDER_SITE_OTHER): Payer: Medicare Other | Admitting: *Deleted

## 2023-04-08 VITALS — Ht 67.0 in | Wt 162.0 lb

## 2023-04-08 DIAGNOSIS — Z0000 Encounter for general adult medical examination without abnormal findings: Secondary | ICD-10-CM | POA: Diagnosis not present

## 2023-04-08 NOTE — Patient Instructions (Signed)
Paul Gregory , Thank you for taking time to come for your Medicare Wellness Visit. I appreciate your ongoing commitment to your health goals. Please review the following plan we discussed and let me know if I can assist you in the future.   Referrals/Orders/Follow-Ups/Clinician Recommendations: None  This is a list of the screening recommended for you and due dates:  Health Maintenance  Topic Date Due   Zoster (Shingles) Vaccine (1 of 2) Never done   Flu Shot  03/21/2023   Medicare Annual Wellness Visit  04/07/2024   Cologuard (Stool DNA test)  09/10/2025   DTaP/Tdap/Td vaccine (3 - Td or Tdap) 02/23/2028   HIV Screening  Completed   HPV Vaccine  Aged Out   Screening for Lung Cancer  Discontinued   COVID-19 Vaccine  Discontinued   Hepatitis C Screening  Discontinued    Advanced directives: (Declined) Advance directive discussed with you today. Even though you declined this today, please call our office should you change your mind, and we can give you the proper paperwork for you to fill out.  Next Medicare Annual Wellness Visit scheduled for next year: Yes 04/08/24 @ 10:30  Preventive Care 40-64 Years, Male Preventive care refers to lifestyle choices and visits with your health care provider that can promote health and wellness. What does preventive care include? A yearly physical exam. This is also called an annual well check. Dental exams once or twice a year. Routine eye exams. Ask your health care provider how often you should have your eyes checked. Personal lifestyle choices, including: Daily care of your teeth and gums. Regular physical activity. Eating a healthy diet. Avoiding tobacco and drug use. Limiting alcohol use. Practicing safe sex. Taking low-dose aspirin every day starting at age 59. What happens during an annual well check? The services and screenings done by your health care provider during your annual well check will depend on your age, overall health,  lifestyle risk factors, and family history of disease. Counseling  Your health care provider may ask you questions about your: Alcohol use. Tobacco use. Drug use. Emotional well-being. Home and relationship well-being. Sexual activity. Eating habits. Work and work Astronomer. Screening  You may have the following tests or measurements: Height, weight, and BMI. Blood pressure. Lipid and cholesterol levels. These may be checked every 5 years, or more frequently if you are over 59 years old. Skin check. Lung cancer screening. You may have this screening every year starting at age 70 if you have a 30-pack-year history of smoking and currently smoke or have quit within the past 15 years. Fecal occult blood test (FOBT) of the stool. You may have this test every year starting at age 67. Flexible sigmoidoscopy or colonoscopy. You may have a sigmoidoscopy every 5 years or a colonoscopy every 10 years starting at age 66. Prostate cancer screening. Recommendations will vary depending on your family history and other risks. Hepatitis C blood test. Hepatitis B blood test. Sexually transmitted disease (STD) testing. Diabetes screening. This is done by checking your blood sugar (glucose) after you have not eaten for a while (fasting). You may have this done every 1-3 years. Discuss your test results, treatment options, and if necessary, the need for more tests with your health care provider. Vaccines  Your health care provider may recommend certain vaccines, such as: Influenza vaccine. This is recommended every year. Tetanus, diphtheria, and acellular pertussis (Tdap, Td) vaccine. You may need a Td booster every 10 years. Zoster vaccine. You may need  this after age 53. Pneumococcal 13-valent conjugate (PCV13) vaccine. You may need this if you have certain conditions and have not been vaccinated. Pneumococcal polysaccharide (PPSV23) vaccine. You may need one or two doses if you smoke cigarettes or  if you have certain conditions. Talk to your health care provider about which screenings and vaccines you need and how often you need them. This information is not intended to replace advice given to you by your health care provider. Make sure you discuss any questions you have with your health care provider. Document Released: 09/02/2015 Document Revised: 04/25/2016 Document Reviewed: 06/07/2015 Elsevier Interactive Patient Education  2017 [employer].  Fall Prevention in the Home Falls can cause injuries. They can happen to people of all ages. There are many things you can do to make your home safe and to help prevent falls. What can I do on the outside of my home? Regularly fix the edges of walkways and driveways and fix any cracks. Remove anything that might make you trip as you walk through a door, such as a raised step or threshold. Trim any bushes or trees on the path to your home. Use bright outdoor lighting. Clear any walking paths of anything that might make someone trip, such as rocks or tools. Regularly check to see if handrails are loose or broken. Make sure that both sides of any steps have handrails. Any raised decks and porches should have guardrails on the edges. Have any leaves, snow, or ice cleared regularly. Use sand or salt on walking paths during winter. Clean up any spills in your garage right away. This includes oil or grease spills. What can I do in the bathroom? Use night lights. Install grab bars by the toilet and in the tub and shower. Do not use towel bars as grab bars. Use non-skid mats or decals in the tub or shower. If you need to sit down in the shower, use a plastic, non-slip stool. Keep the floor dry. Clean up any water that spills on the floor as soon as it happens. Remove soap buildup in the tub or shower regularly. Attach bath mats securely with double-sided non-slip rug tape. Do not have throw rugs and other things on the floor that can make you  trip. What can I do in the bedroom? Use night lights. Make sure that you have a light by your bed that is easy to reach. Do not use any sheets or blankets that are too big for your bed. They should not hang down onto the floor. Have a firm chair that has side arms. You can use this for support while you get dressed. Do not have throw rugs and other things on the floor that can make you trip. What can I do in the kitchen? Clean up any spills right away. Avoid walking on wet floors. Keep items that you use a lot in easy-to-reach places. If you need to reach something above you, use a strong step stool that has a grab bar. Keep electrical cords out of the way. Do not use floor polish or wax that makes floors slippery. If you must use wax, use non-skid floor wax. Do not have throw rugs and other things on the floor that can make you trip. What can I do with my stairs? Do not leave any items on the stairs. Make sure that there are handrails on both sides of the stairs and use them. Fix handrails that are broken or loose. Make sure that handrails are  as long as the stairways. Check any carpeting to make sure that it is firmly attached to the stairs. Fix any carpet that is loose or worn. Avoid having throw rugs at the top or bottom of the stairs. If you do have throw rugs, attach them to the floor with carpet tape. Make sure that you have a light switch at the top of the stairs and the bottom of the stairs. If you do not have them, ask someone to add them for you. What else can I do to help prevent falls? Wear shoes that: Do not have high heels. Have rubber bottoms. Are comfortable and fit you well. Are closed at the toe. Do not wear sandals. If you use a stepladder: Make sure that it is fully opened. Do not climb a closed stepladder. Make sure that both sides of the stepladder are locked into place. Ask someone to hold it for you, if possible. Clearly mark and make sure that you can  see: Any grab bars or handrails. First and last steps. Where the edge of each step is. Use tools that help you move around (mobility aids) if they are needed. These include: Canes. Walkers. Scooters. Crutches. Turn on the lights when you go into a dark area. Replace any light bulbs as soon as they burn out. Set up your furniture so you have a clear path. Avoid moving your furniture around. If any of your floors are uneven, fix them. If there are any pets around you, be aware of where they are. Review your medicines with your doctor. Some medicines can make you feel dizzy. This can increase your chance of falling. Ask your doctor what other things that you can do to help prevent falls. This information is not intended to replace advice given to you by your health care provider. Make sure you discuss any questions you have with your health care provider. Document Released: 06/02/2009 Document Revised: 01/12/2016 Document Reviewed: 09/10/2014 Elsevier Interactive Patient Education  2017 [employer].

## 2023-04-08 NOTE — Progress Notes (Signed)
Subjective:   Paul Gregory is a 52 y.o. male who presents for an Initial Medicare Annual Wellness Visit.  Visit Complete: Virtual  I connected with  Paul Gregory on 04/08/23 by a audio enabled telemedicine application and verified that I am speaking with the correct person using two identifiers.  Patient Location: Home  Provider Location: Office/Clinic  I discussed the limitations of evaluation and management by telemedicine. The patient expressed understanding and agreed to proceed.  Vital Signs: Unable to obtain new vitals due to this being a telehealth visit.   Review of Systems     Cardiac Risk Factors include: male gender;smoking/ tobacco exposure;sedentary lifestyle     Objective:    Today's Vitals   04/08/23 1120  Weight: 162 lb (73.5 kg)  Height: 5\' 7"  (1.702 m)   Body mass index is 25.37 kg/m.     04/08/2023   11:34 AM 12/21/2019    8:25 AM 09/07/2019    2:11 PM 08/05/2019   10:32 AM 08/04/2019   10:00 PM 09/30/2018    8:04 AM 02/22/2018   11:40 AM  Advanced Directives  Does Patient Have a Medical Advance Directive? No No No No No No No  Would patient like information on creating a medical advance directive? No - Patient declined No - Patient declined Yes (MAU/Ambulatory/Procedural Areas - Information given) No - Patient declined No - Patient declined  No - Patient declined    Current Medications (verified) Outpatient Encounter Medications as of 04/08/2023  Medication Sig   ibuprofen (ADVIL) 200 MG tablet Take 400 mg by mouth every 6 (six) hours as needed for moderate pain.   propranolol (INDERAL) 10 MG tablet TAKE 1 TABLET(10 MG) BY MOUTH THREE TIMES DAILY AS NEEDED   No facility-administered encounter medications on file as of 04/08/2023.    Allergies (verified) Patient has no known allergies.   History: Past Medical History:  Diagnosis Date   Alcohol use    Anxiety    Hematuria    Tobacco use    Past Surgical History:  Procedure Laterality  Date   APPLICATION OF WOUND VAC Right 08/05/2019   Procedure: Application Of Wound Vac;  Surgeon: Roby Lofts, MD;  [facility];  Service: Orthopedics;  Laterality: Right;   I & D EXTREMITY Right 08/05/2019   Procedure: IRRIGATION AND DEBRIDEMENT RIGHT LEG;  Surgeon: Roby Lofts, MD;  Location: [facility];  Service: Orthopedics;  Laterality: Right;   LAPAROTOMY N/A 08/04/2019   Procedure: EXPLORATORY LAPAROTOMY, splenectomy, small bowel resection;  Surgeon: Berna Bue, MD;  Location: [facility];  Service: General;  Laterality: N/A;   SPLENECTOMY, TOTAL     TIBIA IM NAIL INSERTION Right 08/05/2019   Procedure: INTRAMEDULLARY (IM) NAIL TIBIAL;  Surgeon: Roby Lofts, MD;  Location: [facility];  Service: Orthopedics;  Laterality: Right;   Family History  Problem Relation Age of Onset   Pancreatitis Brother    Pancreatitis Daughter    Social History   Socioeconomic History   Marital status: Divorced    Spouse name: Not on file   Number of children: Not on file   Years of education: Not on file   Highest education level: Not on file  Occupational History   Not on file  Tobacco Use   Smoking status: Every Day    Current packs/day: 0.50    Average packs/day: 0.5 packs/day for 30.0 years (15.0 ttl pk-yrs)    Types: Cigarettes   Smokeless tobacco: Never  Vaping Use   Vaping status: Never Used  Substance and Sexual Activity   Alcohol use: Yes    Alcohol/week: 21.0 standard drinks of alcohol    Types: 21 Cans of beer per week    Comment: 3 times a week   Drug use: Not Currently    Types: Marijuana   Sexual activity: Yes  Other Topics Concern   Not on file  Social History Narrative   ** Merged History Encounter **       Social Determinants of Health   Financial Resource Strain: Low Risk  (04/08/2023)   Overall Financial Resource Strain (CARDIA)    Difficulty of Paying Living Expenses: Not hard at all  Food Insecurity: No Food Insecurity (04/08/2023)   Hunger Vital Sign     Worried About Running Out of Food in the Last Year: Never true    Ran Out of Food in the Last Year: Never true  Transportation Needs: No Transportation Needs (04/08/2023)   PRAPARE - Administrator, Civil Service (Medical): No    Lack of Transportation (Non-Medical): No  Physical Activity: Inactive (04/08/2023)   Exercise Vital Sign    Days of Exercise per Week: 0 days    Minutes of Exercise per Session: 0 min  Stress: Stress Concern Present (04/08/2023)   [employer] of Occupational Health - Occupational Stress Questionnaire    Feeling of Stress : To some extent  Social Connections: Unknown (04/08/2023)   Social Connection and Isolation Panel [NHANES]    Frequency of Communication with Friends and Family: Patient unable to answer    Frequency of Social Gatherings with Friends and Family: Patient unable to answer    Attends Religious Services: 1 to 4 times per year    Active Member of [employer] or Organizations: No    Attends Engineer, structural: Never    Marital Status: Divorced    Tobacco Counseling Ready to quit: No Counseling given: Not Answered   Clinical Intake:  Pre-visit preparation completed: Yes  Pain : No/denies pain     BMI - recorded: 25.37 Nutritional Status: BMI 25 -29 Overweight Nutritional Risks: None Diabetes: No  How often do you need to have someone help you when you read instructions, pamphlets, or other written materials from your doctor or pharmacy?: 1 - Never  Interpreter Needed?: No  Information entered by :: R. Sahara Fujimoto LPN   Activities of Daily Living    04/08/2023   11:23 AM  In your present state of health, do you have any difficulty performing the following activities:  Hearing? 0  Vision? 0  Comment readers  Difficulty concentrating or making decisions? 0  Walking or climbing stairs? 1  Comment bad leg  Dressing or bathing? 0  Doing errands, shopping? 1  Comment someone has to drive him  Preparing Food and  eating ? N  Using the Toilet? N  In the past six months, have you accidently leaked urine? N  Do you have problems with loss of bowel control? N  Managing your Medications? N  Managing your Finances? N  Housekeeping or managing your Housekeeping? N    Patient Care Team: Kara Dies, NP as PCP - General (Nurse Practitioner) Patient, No Pcp Per (General Practice)  Indicate any recent Medical Services you may have received from other than [facility] providers in the past year (date may be approximate).     Assessment:   This is a routine wellness examination for [place].  Hearing/Vision screen Hearing Screening -  Comments:: No issues Vision Screening - Comments:: readers  Dietary issues and exercise activities discussed:     Goals Addressed             This Visit's Progress    Patient Stated       Cut back on smoking       Depression Screen    04/08/2023   11:30 AM 01/10/2023    8:20 AM 06/28/2022    9:45 AM  PHQ 2/9 Scores  PHQ - 2 Score 0 0 0  PHQ- 9 Score 1      Fall Risk    04/08/2023   11:26 AM 01/10/2023    8:19 AM  Fall Risk   Falls in the past year? 0 0  Number falls in past yr: 0 0  Injury with Fall? 0 0  Risk for fall due to : No Fall Risks No Fall Risks  Follow up Falls prevention discussed;Falls evaluation completed Falls evaluation completed    MEDICARE RISK AT HOME: Medicare Risk at Home Any stairs in or around the home?: Yes If so, are there any without handrails?: No Home free of loose throw rugs in walkways, pet beds, electrical cords, etc?: Yes Adequate lighting in your home to reduce risk of falls?: Yes Life alert?: No Use of a cane, walker or w/c?: Yes Grab bars in the bathroom?: No Shower chair or bench in shower?: Yes Elevated toilet seat or a handicapped toilet?: No  Cognitive Function:        04/08/2023   11:36 AM  6CIT Screen  What Year? 0 points  What month? 0 points  What time? 0 points  Count back from 20 2 points   Months in reverse 4 points  Repeat phrase 2 points  Total Score 8 points    Immunizations Immunization History  Administered Date(s) Administered   HIB (PRP-T) 08/08/2019   Meningococcal B, OMV 10/12/2019, 12/21/2019   Meningococcal Mcv4o 08/08/2019, 10/12/2019   Pneumococcal Conjugate-13 08/08/2019   Pneumococcal Polysaccharide-23 10/12/2019   Tdap 11/28/2011, 02/22/2018    TDAP status: Up to date  Flu Vaccine status: Declined, Education has been provided regarding the importance of this vaccine but patient still declined. Advised may receive this vaccine at local pharmacy or Health Dept. Aware to provide a copy of the vaccination record if obtained from local pharmacy or Health Dept. Verbalized acceptance and understanding.  Pneumococcal vaccine status: Declined,  Education has been provided regarding the importance of this vaccine but patient still declined. Advised may receive this vaccine at local pharmacy or Health Dept. Aware to provide a copy of the vaccination record if obtained from local pharmacy or Health Dept. Verbalized acceptance and understanding.   Covid-19 vaccine status: Declined, Education has been provided regarding the importance of this vaccine but patient still declined. Advised may receive this vaccine at local pharmacy or Health Dept.or vaccine clinic. Aware to provide a copy of the vaccination record if obtained from local pharmacy or Health Dept. Verbalized acceptance and understanding.  Qualifies for Shingles Vaccine? Yes   Zostavax completed No   Shingrix Completed?: No.    Education has been provided regarding the importance of this vaccine. Patient has been advised to call insurance company to determine out of pocket expense if they have not yet received this vaccine. Advised may also receive vaccine at local pharmacy or Health Dept. Verbalized acceptance and understanding.  Screening Tests Health Maintenance  Topic Date Due   Medicare Annual Wellness  (AWV)  Never  done   Colonoscopy  Never done   Zoster Vaccines- Shingrix (1 of 2) Never done   INFLUENZA VACCINE  03/21/2023   DTaP/Tdap/Td (3 - Td or Tdap) 02/23/2028   HIV Screening  Completed   HPV VACCINES  Aged Out   Lung Cancer Screening  Discontinued   COVID-19 Vaccine  Discontinued   Hepatitis C Screening  Discontinued    Health Maintenance  Health Maintenance Due  Topic Date Due   Medicare Annual Wellness (AWV)  Never done   Colonoscopy  Never done   Zoster Vaccines- Shingrix (1 of 2) Never done   INFLUENZA VACCINE  03/21/2023    Colorectal cancer screening: Type of screening: Cologuard. Completed 1/24. Repeat every 3 years  Lung Cancer Screening: (Low Dose CT Chest recommended if Age 12-80 years, 20 pack-year currently smoking OR have quit w/in 15years.) does qualify.  Patient declines lung cancer screening   Additional Screening:  Hepatitis C Screening: does qualify; Completed 12/20  Vision Screening: Recommended annual ophthalmology exams for early detection of glaucoma and other disorders of the eye. Is the patient up to date with their annual eye exam?  No  Who is the provider or what is the name of the office in which the patient attends annual eye exams?  Information given to patient for [facility]. Patient stated that he will call and schedule an appointment If pt is not established with a provider, would they like to be referred to a provider to establish care? No .   Dental Screening: Recommended annual dental exams for proper oral hygiene   Community Resource Referral / Chronic Care Management: CRR required this visit?  No   CCM required this visit?  No    Plan:     I have personally reviewed and noted the following in the patient's chart:   Medical and social history Use of alcohol, tobacco or illicit drugs  Current medications and supplements including opioid prescriptions. Patient is not currently taking opioid  prescriptions. Functional ability and status Nutritional status Physical activity Advanced directives List of other physicians Hospitalizations, surgeries, and ER visits in previous 12 months Vitals Screenings to include cognitive, depression, and falls Referrals and appointments  In addition, I have reviewed and discussed with patient certain preventive protocols, quality metrics, and best practice recommendations. A written personalized care plan for preventive services as well as general preventive health recommendations were provided to patient.     Sydell Axon, LPN   11/10/4008   After Visit Summary: (Declined) Due to this being a telephonic visit, with patients personalized plan was offered to patient but patient Declined AVS at this time   Nurse Notes: None

## 2023-07-25 ENCOUNTER — Encounter: Payer: Self-pay | Admitting: Nurse Practitioner

## 2023-07-25 ENCOUNTER — Ambulatory Visit: Payer: Medicare Other | Admitting: Nurse Practitioner

## 2023-07-25 VITALS — BP 130/78 | HR 99 | Temp 97.5°F | Ht 67.0 in | Wt 169.2 lb

## 2023-07-25 DIAGNOSIS — Z125 Encounter for screening for malignant neoplasm of prostate: Secondary | ICD-10-CM

## 2023-07-25 DIAGNOSIS — Z0001 Encounter for general adult medical examination with abnormal findings: Secondary | ICD-10-CM

## 2023-07-25 DIAGNOSIS — J302 Other seasonal allergic rhinitis: Secondary | ICD-10-CM | POA: Insufficient documentation

## 2023-07-25 DIAGNOSIS — E559 Vitamin D deficiency, unspecified: Secondary | ICD-10-CM

## 2023-07-25 DIAGNOSIS — F17200 Nicotine dependence, unspecified, uncomplicated: Secondary | ICD-10-CM | POA: Diagnosis not present

## 2023-07-25 DIAGNOSIS — Z789 Other specified health status: Secondary | ICD-10-CM

## 2023-07-25 DIAGNOSIS — Z0000 Encounter for general adult medical examination without abnormal findings: Secondary | ICD-10-CM | POA: Insufficient documentation

## 2023-07-25 LAB — COMPREHENSIVE METABOLIC PANEL
ALT: 26 U/L (ref 0–53)
AST: 27 U/L (ref 0–37)
Albumin: 4.3 g/dL (ref 3.5–5.2)
Alkaline Phosphatase: 57 U/L (ref 39–117)
BUN: 10 mg/dL (ref 6–23)
CO2: 28 meq/L (ref 19–32)
Calcium: 9.4 mg/dL (ref 8.4–10.5)
Chloride: 103 meq/L (ref 96–112)
Creatinine, Ser: 0.7 mg/dL (ref 0.40–1.50)
GFR: 106.14 mL/min (ref >60.00)
Glucose, Bld: 100 mg/dL — ABNORMAL HIGH (ref 70–99)
Potassium: 4.4 meq/L (ref 3.5–5.1)
Sodium: 139 meq/L (ref 135–145)
Total Bilirubin: 0.8 mg/dL (ref 0.2–1.2)
Total Protein: 6.4 g/dL (ref 6.0–8.3)

## 2023-07-25 LAB — CBC WITH DIFFERENTIAL/PLATELET
Basophils Absolute: 0.1 10*3/uL (ref 0.0–0.1)
Basophils Relative: 1.2 % (ref 0.0–3.0)
Eosinophils Absolute: 0.1 10*3/uL (ref 0.0–0.7)
Eosinophils Relative: 1.5 % (ref 0.0–5.0)
HCT: 46.9 % (ref 39.0–52.0)
Hemoglobin: 16.8 g/dL (ref 13.0–17.0)
Lymphocytes Relative: 28.6 % (ref 12.0–46.0)
Lymphs Abs: 2.4 10*3/uL (ref 0.7–4.0)
MCHC: 35.8 g/dL (ref 30.0–36.0)
MCV: 104.6 fL — ABNORMAL HIGH (ref 78.0–100.0)
Monocytes Absolute: 1.1 10*3/uL — ABNORMAL HIGH (ref 0.1–1.0)
Monocytes Relative: 13.3 % — ABNORMAL HIGH (ref 3.0–12.0)
Neutro Abs: 4.7 10*3/uL (ref 1.4–7.7)
Neutrophils Relative %: 55.4 % (ref 43.0–77.0)
Platelets: 362 10*3/uL (ref 150.0–400.0)
RBC: 4.49 Mil/uL (ref 4.22–5.81)
RDW: 13.8 % (ref 11.5–15.5)
WBC: 8.5 10*3/uL (ref 4.0–10.5)

## 2023-07-25 LAB — LIPID PANEL
Cholesterol: 173 mg/dL (ref 0–200)
HDL: 64.6 mg/dL (ref >39.00)
LDL Cholesterol: 100 mg/dL — ABNORMAL HIGH (ref 0–99)
NonHDL: 108.57
Total CHOL/HDL Ratio: 3
Triglycerides: 44 mg/dL (ref 0.0–149.0)
VLDL: 8.8 mg/dL (ref 0.0–40.0)

## 2023-07-25 LAB — TSH: TSH: 2.12 u[IU]/mL (ref 0.35–5.50)

## 2023-07-25 LAB — VITAMIN D 25 HYDROXY (VIT D DEFICIENCY, FRACTURES): VITD: 51.8 ng/mL (ref 30.00–100.00)

## 2023-07-25 LAB — B12 AND FOLATE PANEL
Folate: 9 ng/mL (ref >5.9)
Vitamin B-12: 167 pg/mL — ABNORMAL LOW (ref 211–911)

## 2023-07-25 LAB — PSA: PSA: 6.87 ng/mL — ABNORMAL HIGH (ref 0.10–4.00)

## 2023-07-25 NOTE — Patient Instructions (Signed)

## 2023-07-25 NOTE — Progress Notes (Signed)
Complete physical exam  Patient: Paul Gregory   DOB: 05/15/1971   52 y.o. Male  MRN: [id number]  Subjective:    Chief Complaint  Patient presents with   Annual Exam    Paul Gregory is a 52 y.o. male who presents today for a complete physical exam. He reports consuming a general diet. Home exercise routine includes yard work. He generally feels well. He reports sleeping well. He does not have additional problems to discuss today.    Most recent fall risk assessment:    07/25/2023    9:24 AM  Fall Risk   Falls in the past year? 0  Number falls in past yr: 0  Injury with Fall? 0  Risk for fall due to : No Fall Risks  Follow up Falls evaluation completed     Most recent depression screenings:    07/25/2023    9:24 AM 04/08/2023   11:30 AM  PHQ 2/9 Scores  PHQ - 2 Score 1 0  PHQ- 9 Score 1 1        Patient Care Team: Kara Dies, NP as PCP - General (Nurse Practitioner) Patient, No Pcp Per (General Practice)   Outpatient Medications Prior to Visit  Medication Sig   ibuprofen (ADVIL) 200 MG tablet Take 400 mg by mouth every 6 (six) hours as needed for moderate pain.   propranolol (INDERAL) 10 MG tablet TAKE 1 TABLET(10 MG) BY MOUTH THREE TIMES DAILY AS NEEDED   No facility-administered medications prior to visit.    ROS Negative unless indicated in HPI.     Objective:     BP 130/78   Pulse 99   Temp (!) 97.5 F (36.4 C)   Ht 5\' 7"  (1.702 m)   Wt 169 lb 3.2 oz (76.7 kg)   SpO2 97%   BMI 26.50 kg/m    Physical Exam Constitutional:      Appearance: Normal appearance. He is normal weight.  HENT:     Head: Normocephalic.     Right Ear: Tympanic membrane normal.     Left Ear: Tympanic membrane normal.     Nose: Nose normal.     Mouth/Throat:     Mouth: Mucous membranes are moist.  Eyes:     Extraocular Movements: Extraocular movements intact.     Conjunctiva/sclera: Conjunctivae normal.     Pupils: Pupils are equal, round, and reactive  to light.  Neck:     Thyroid: No thyroid mass or thyroid tenderness.  Cardiovascular:     Rate and Rhythm: Normal rate and regular rhythm.     Pulses: Normal pulses.     Heart sounds: Normal heart sounds. No murmur heard. Pulmonary:     Effort: Pulmonary effort is normal.     Breath sounds: Normal breath sounds.  Abdominal:     General: Bowel sounds are normal.     Palpations: Abdomen is soft. There is no mass.     Tenderness: There is no abdominal tenderness. There is no rebound.  Musculoskeletal:        General: No swelling.     Cervical back: Neck supple. No tenderness.     Right lower leg: No edema.     Left lower leg: No edema.  Skin:    Findings: No bruising, erythema or rash.  Neurological:     General: No focal deficit present.     Mental Status: He is alert and oriented to person, place, and time. Mental status is at baseline.  Psychiatric:        Mood and Affect: Mood normal.        Behavior: Behavior normal.        Thought Content: Thought content normal.        Judgment: Judgment normal.      No results found for any visits on 07/25/23.     Assessment & Plan:    Routine Health Maintenance and Physical Exam  Immunization History  Administered Date(s) Administered   HIB (PRP-T) 08/08/2019   Meningococcal B, OMV 10/12/2019, 12/21/2019   Meningococcal Mcv4o 08/08/2019, 10/12/2019   Pneumococcal Conjugate-13 08/08/2019   Pneumococcal Polysaccharide-23 10/12/2019   Tdap 11/28/2011, 02/22/2018    Health Maintenance  Topic Date Due   Zoster Vaccines- Shingrix (1 of 2) Never done   INFLUENZA VACCINE  11/18/2023 (Originally 03/21/2023)   Medicare Annual Wellness (AWV)  04/07/2024   Fecal DNA (Cologuard)  09/10/2025   DTaP/Tdap/Td (3 - Td or Tdap) 02/23/2028   HIV Screening  Completed   HPV VACCINES  Aged Out   Lung Cancer Screening  Discontinued   COVID-19 Vaccine  Discontinued   Hepatitis C Screening  Discontinued      Annual physical exam Assessment  & Plan: Discussed health benefits of physical activity, and encouraged him to engage in regular exercise appropriate for his age and condition. Encouraged patient to consume a balanced diet and regular exercise regimen. Advised to see an eye doctor and dentist annually. Declined flu and shingle vaccine. Labs ordered.   Orders: -     Comprehensive metabolic panel -     Lipid panel -     TSH -     PSA  Vitamin D deficiency -     CBC with Differential/Platelet -     VITAMIN D 25 Hydroxy (Vit-D Deficiency, Fractures)  Alcohol use -     B12 and Folate Panel  Smoking -     Ambulatory Referral for Lung Cancer Scre  Smoker Assessment & Plan: Smoke half pack a day Smoking cessation discussed. Patient not motivated to quit smoking at present. [place]       Return in about 6 months (around 01/23/2024) for chronic management.     Kara Dies, NP

## 2023-07-25 NOTE — Assessment & Plan Note (Addendum)
Discussed health benefits of physical activity, and encouraged him to engage in regular exercise appropriate for his age and condition. Encouraged patient to consume a balanced diet and regular exercise regimen. Advised to see an eye doctor and dentist annually. Declined flu and shingle vaccine. Labs ordered.

## 2023-07-25 NOTE — Assessment & Plan Note (Signed)
Smoke half pack a day Smoking cessation discussed. Patient not motivated to quit smoking at present. [place].

## 2023-08-01 ENCOUNTER — Other Ambulatory Visit: Payer: Self-pay

## 2023-08-01 DIAGNOSIS — F17210 Nicotine dependence, cigarettes, uncomplicated: Secondary | ICD-10-CM

## 2023-08-01 DIAGNOSIS — Z87891 Personal history of nicotine dependence: Secondary | ICD-10-CM

## 2023-08-01 DIAGNOSIS — Z122 Encounter for screening for malignant neoplasm of respiratory organs: Secondary | ICD-10-CM

## 2023-08-05 ENCOUNTER — Encounter: Payer: Medicare Other | Admitting: Adult Health

## 2023-08-08 ENCOUNTER — Ambulatory Visit: Payer: Medicare Other

## 2023-08-14 ENCOUNTER — Other Ambulatory Visit: Payer: Self-pay | Admitting: Nurse Practitioner

## 2023-08-14 DIAGNOSIS — R972 Elevated prostate specific antigen [PSA]: Secondary | ICD-10-CM

## 2023-08-14 NOTE — Progress Notes (Signed)
Please inform patient: The blood work shows no sign of anemia, electrolytes normal. Liver, kidney, thyroid cholesterol and Vit Dnormal. Vit B-12 low, recommend to take over-the-counter 1000 mcg vitamin B12 supplement daily. PSA elevated we will repeat labs  in 4 weeks.

## 2023-09-18 ENCOUNTER — Other Ambulatory Visit (INDEPENDENT_AMBULATORY_CARE_PROVIDER_SITE_OTHER): Payer: Medicare Other

## 2023-09-18 DIAGNOSIS — R972 Elevated prostate specific antigen [PSA]: Secondary | ICD-10-CM | POA: Diagnosis not present

## 2023-09-18 LAB — PSA: PSA: 6.29 ng/mL — ABNORMAL HIGH (ref 0.10–4.00)

## 2023-09-19 ENCOUNTER — Other Ambulatory Visit: Payer: Self-pay | Admitting: Nurse Practitioner

## 2023-09-19 DIAGNOSIS — R972 Elevated prostate specific antigen [PSA]: Secondary | ICD-10-CM

## 2023-09-19 NOTE — Progress Notes (Signed)
PSA is still elevated. Please follow up with the urology. Referral placed

## 2023-10-01 ENCOUNTER — Ambulatory Visit: Payer: Medicare Other | Admitting: Physician Assistant

## 2023-10-01 DIAGNOSIS — R310 Gross hematuria: Secondary | ICD-10-CM | POA: Diagnosis not present

## 2023-10-01 DIAGNOSIS — R399 Unspecified symptoms and signs involving the genitourinary system: Secondary | ICD-10-CM | POA: Diagnosis not present

## 2023-10-01 DIAGNOSIS — A630 Anogenital (venereal) warts: Secondary | ICD-10-CM

## 2023-10-01 DIAGNOSIS — R972 Elevated prostate specific antigen [PSA]: Secondary | ICD-10-CM | POA: Diagnosis not present

## 2023-10-01 LAB — MICROSCOPIC EXAMINATION

## 2023-10-01 LAB — URINALYSIS, COMPLETE
Bilirubin, UA: NEGATIVE
Ketones, UA: NEGATIVE
Leukocytes,UA: NEGATIVE
Nitrite, UA: NEGATIVE
Specific Gravity, UA: 1.01 (ref 1.005–1.030)
Urobilinogen, Ur: 0.2 mg/dL (ref 0.2–1.0)
pH, UA: 6.5 (ref 5.0–7.5)

## 2023-10-01 LAB — BLADDER SCAN AMB NON-IMAGING: Scan Result: 8

## 2023-10-01 MED ORDER — TAMSULOSIN HCL 0.4 MG PO CAPS: 0.4000 mg | ORAL_CAPSULE | Freq: Every day | ORAL | 11 refills | Status: AC

## 2023-10-01 NOTE — Progress Notes (Signed)
10/01/2023 11:03 AM   Paul Gregory 10/12/1970 [id number]  CC: Chief Complaint  Patient presents with   Follow-up   HPI: Paul Gregory is a 53 y.o. male with PMH microscopic hematuria who did not complete hematuria workup and ED previously on tadalafil who presents today for evaluation of elevated PSA.   Today he reports progressive worsening in LUTS including weak stream, frequency, and urgency.  He has noticed some stinging at the tip of his penis with voiding for about a month.  He has also been having intermittent gross hematuria and hematospermia.    He denies flank pain or history of kidney stones.  He has an approximate 20+ pack year smoking history and he reports his father was diagnosed with prostate and colon cancers at age 36 and died that same year.  He thinks his paternal grandfather also had prostate cancer, but does not know the age of diagnosis or whether or not this contributed to his demise.  PSA history as follows: 09/18/2023: 6.29 07/25/2023: 6.87 08/24/2014: 1.67  Bladder scan on arrival 8mL.  PMH: Past Medical History:  Diagnosis Date   Alcohol use    Anxiety    Hematuria    Tobacco use     Surgical History: Past Surgical History:  Procedure Laterality Date   APPLICATION OF WOUND VAC Right 08/05/2019   Procedure: Application Of Wound Vac;  Surgeon: Roby Lofts, MD;  Location: [facility];  Service: Orthopedics;  Laterality: Right;   I & D EXTREMITY Right 08/05/2019   Procedure: IRRIGATION AND DEBRIDEMENT RIGHT LEG;  Surgeon: Roby Lofts, MD;  Location: [facility];  Service: Orthopedics;  Laterality: Right;   LAPAROTOMY N/A 08/04/2019   Procedure: EXPLORATORY LAPAROTOMY, splenectomy, small bowel resection;  Surgeon: Berna Bue, MD;  Location: [facility] OR;  Service: General;  Laterality: N/A;   SPLENECTOMY, TOTAL     TIBIA IM NAIL INSERTION Right 08/05/2019   Procedure: INTRAMEDULLARY (IM) NAIL TIBIAL;  Surgeon: Roby Lofts, MD;  Location: [facility];   Service: Orthopedics;  Laterality: Right;    Home Medications:  Allergies as of 10/01/2023   No Known Allergies      Medication List        Accurate as of October 01, 2023 11:03 AM. If you have any questions, ask your nurse or doctor.          ibuprofen 200 MG tablet Commonly known as: ADVIL Take 400 mg by mouth every 6 (six) hours as needed for moderate pain.   propranolol 10 MG tablet Commonly known as: INDERAL TAKE 1 TABLET(10 MG) BY MOUTH THREE TIMES DAILY AS NEEDED        Allergies:  No Known Allergies  Family History: Family History  Problem Relation Age of Onset   Pancreatitis Brother    Pancreatitis Daughter     Social History:   reports that he has been smoking cigarettes. He has a 15 pack-year smoking history. He has never used smokeless tobacco. He reports current alcohol use of about 7.0 standard drinks of alcohol per week. He reports that he does not currently use drugs after having used the following drugs: Marijuana.  Physical Exam: There were no vitals taken for this visit.  Constitutional:  Alert and oriented, no acute distress, nontoxic appearing HEENT: , AT Cardiovascular: No clubbing, cyanosis, or edema Respiratory: Normal respiratory effort, no increased work of breathing GU: Narrow but patent urethral meatus, no hypopigmentation or scarring at the meatus.  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  Normal sphincter tone, smooth 40+ cc prostate with a subtle nodule on the left lateral lobe. Skin: No rashes, bruises or suspicious lesions Neurologic: Grossly intact, no focal deficits, moving all 4 extremities Psychiatric: Normal mood and affect  Laboratory Data: Results for orders placed or performed in visit on 10/01/23  BLADDER SCAN AMB NON-IMAGING   Collection Time: 10/01/23 10:48 AM  Result Value Ref Range   Scan Result 8 ml    Assessment & Plan:   1. Elevated PSA (Primary) PSA is abnormally high especially  in light of prostate size, though insufficient historic values to trend.  I do note a subtle nodule on the left lateral lobe on DRE today and he has a family history of likely high risk prostate cancer.  I recommended pursuing biopsy and he agreed. - Urinalysis, Complete - BLADDER SCAN AMB NON-IMAGING  2. Gross hematuria He previously failed to complete hematuria workup for his microscopic hematuria, but he is now reporting gross hematuria.  We again discussed the importance of working this up with a CT urogram and cystoscopy and he is amenable.  I asked him to leave a urine sample today on the way out and we will call him with his results. - Urinalysis, Complete - CT HEMATURIA WORKUP; Future  3. Lower urinary tract symptoms (LUTS) Progressive LUTS including weak stream, frequency, and urgency.  His meatus appears a bit narrow to me and I question possible underlying stricture, all the more reason for him to undergo cystoscopy.  Will start Flomax in the interim to see if this helps him manage his symptoms better. - tamsulosin (FLOMAX) 0.4 MG CAPS capsule; Take 1 capsule (0.4 mg total) by mouth daily.  Dispense: 30 capsule; Refill: 11  4. Genital warts Noted on physical exam today.  Return for Cysto, CTU results, and prostate biopsy with Dr. Lonna Cobb.  Paul Ching, PA-C  [facility] [address] [place], [place] [address] [phone]

## 2023-10-01 NOTE — Patient Instructions (Signed)
Cystoscopy Cystoscopy is a procedure that is used to help diagnose and sometimes treat conditions that affect the lower urinary tract. The lower urinary tract includes the bladder and the urethra. The urethra is the tube that drains urine from the bladder. Cystoscopy is done using a thin, tube-shaped instrument with a light and camera at the end (cystoscope). The cystoscope may be hard or flexible, depending on the goal of the procedure. The cystoscope is inserted through the urethra, into the bladder. Cystoscopy may be recommended if you have: Urinary tract infections that keep coming back. Blood in the urine (hematuria). An inability to control when you urinate (urinary incontinence) or an overactive bladder. Unusual cells found in a urine sample. A blockage in the urethra, such as a urinary stone. Painful urination. An abnormality in the bladder found during an intravenous pyelogram (IVP) or CT scan. What are the risks? Generally, this is a safe procedure. However, problems may occur, including: Infection. Bleeding.  What happens during the procedure?  You will be given one or more of the following: A medicine to numb the area (local anesthetic). The area around the opening of your urethra will be cleaned. The cystoscope will be passed through your urethra into your bladder. Germ-free (sterile) fluid will flow through the cystoscope to fill your bladder. The fluid will stretch your bladder so that your health care provider can clearly examine your bladder walls. Your doctor will look at the urethra and bladder. The cystoscope will be removed The procedure may vary among health care providers  What can I expect after the procedure? After the procedure, it is common to have: Some soreness or pain in your urethra. Urinary symptoms. These include: Mild pain or burning when you urinate. Pain should stop within a few minutes after you urinate. This may last for up to a few days after the  procedure. A small amount of blood in your urine for several days. Feeling like you need to urinate but producing only a small amount of urine. Follow these instructions at home: General instructions Return to your normal activities as told by your health care provider.  Drink plenty of fluids after the procedure. Keep all follow-up visits as told by your health care provider. This is important. Contact a health care provider if you: Have pain that gets worse or does not get better with medicine, especially pain when you urinate lasting longer than 72 hours after the procedure. Have trouble urinating. Get help right away if you: Have blood clots in your urine. Have a fever or chills. Are unable to urinate. Summary Cystoscopy is a procedure that is used to help diagnose and sometimes treat conditions that affect the lower urinary tract. Cystoscopy is done using a thin, tube-shaped instrument with a light and camera at the end. After the procedure, it is common to have some soreness or pain in your urethra. It is normal to have blood in your urine after the procedure.  If you were prescribed an antibiotic medicine, take it as told by your health care provider.  This information is not intended to replace advice given to you by your health care provider. Make sure you discuss any questions you have with your health care provider. Document Revised: 07/29/2018 Document Reviewed: 07/29/2018 Elsevier Patient Education  2020 [employer].   Prostate Biopsy Instructions  Stop all aspirin or blood thinners (aspirin, plavix, coumadin, warfarin, motrin, ibuprofen, advil, aleve, naproxen, naprosyn) for 7 days prior to the procedure.  If you have  any questions about stopping these medications, please contact your primary care physician or cardiologist.  Having a light meal prior to the procedure is recommended.  If you are diabetic or have low blood sugar please bring a small snack or glucose  tablet.  A Fleets enema is needed to be purchased over the counter at a local pharmacy and used 2 hours before you scheduled appointment.  This can be purchased over the counter at any pharmacy.  Antibiotics will be administered in the clinic at the time of the procedure unless otherwise specified.    Please bring someone with you to the procedure to drive you home.  A follow up appointment has been scheduled for you to receive the results of the biopsy.  If you have any questions or concerns, please feel free to call the office at [phone] or send a Mychart message.    Thank you, Staff at [facility]

## 2023-10-01 NOTE — Addendum Note (Signed)
Addended by: Debarah Crape on: 10/01/2023 04:42 PM   Modules accepted: Orders

## 2023-10-06 LAB — CULTURE, URINE COMPREHENSIVE

## 2023-11-04 ENCOUNTER — Ambulatory Visit (INDEPENDENT_AMBULATORY_CARE_PROVIDER_SITE_OTHER): Payer: Medicare Other | Admitting: Urology

## 2023-11-04 ENCOUNTER — Encounter: Payer: Self-pay | Admitting: Urology

## 2023-11-04 VITALS — BP 154/94 | HR 89 | Ht 67.0 in | Wt 165.0 lb

## 2023-11-04 DIAGNOSIS — R310 Gross hematuria: Secondary | ICD-10-CM

## 2023-11-04 MED ORDER — SULFAMETHOXAZOLE-TRIMETHOPRIM 800-160 MG PO TABS: 1.0000 | ORAL_TABLET | Freq: Two times a day (BID) | ORAL | 0 refills | Status: AC

## 2023-11-05 LAB — MICROSCOPIC EXAMINATION

## 2023-11-05 LAB — URINALYSIS, COMPLETE
Bilirubin, UA: NEGATIVE
Glucose, UA: NEGATIVE
Nitrite, UA: NEGATIVE
Specific Gravity, UA: 1.02 (ref 1.005–1.030)
Urobilinogen, Ur: 1 mg/dL (ref 0.2–1.0)
pH, UA: 7 (ref 5.0–7.5)

## 2023-11-06 NOTE — Progress Notes (Signed)
 Scheduled for cystoscopy today however complaining of dysuria and UA with 11-30 RBC/11-30 WBC.  Urine culture ordered and cystoscopy was rescheduled.

## 2023-11-07 LAB — CULTURE, URINE COMPREHENSIVE

## 2023-11-20 ENCOUNTER — Other Ambulatory Visit: Payer: Medicare Other | Admitting: Urology

## 2023-12-04 ENCOUNTER — Ambulatory Visit: Payer: Medicare Other | Admitting: Urology

## 2023-12-25 ENCOUNTER — Other Ambulatory Visit: Admitting: Urology

## 2024-01-23 ENCOUNTER — Ambulatory Visit: Payer: Medicare Other | Admitting: Nurse Practitioner

## 2024-02-03 ENCOUNTER — Telehealth: Payer: Self-pay

## 2024-02-03 NOTE — Telephone Encounter (Signed)
 I called patient to reschedule appointment and he states he would like for me to send his condolences to Tona Francis, NP.

## 2024-02-06 ENCOUNTER — Ambulatory Visit: Admitting: Nurse Practitioner

## 2024-03-06 ENCOUNTER — Encounter: Payer: Self-pay | Admitting: Nurse Practitioner

## 2024-03-06 ENCOUNTER — Ambulatory Visit (INDEPENDENT_AMBULATORY_CARE_PROVIDER_SITE_OTHER): Admitting: Nurse Practitioner

## 2024-03-06 VITALS — BP 122/80 | HR 96 | Temp 97.7°F | Ht 67.0 in | Wt 170.2 lb

## 2024-03-06 DIAGNOSIS — F419 Anxiety disorder, unspecified: Secondary | ICD-10-CM | POA: Diagnosis not present

## 2024-03-06 DIAGNOSIS — R972 Elevated prostate specific antigen [PSA]: Secondary | ICD-10-CM

## 2024-03-06 DIAGNOSIS — Z716 Tobacco abuse counseling: Secondary | ICD-10-CM

## 2024-03-06 MED ORDER — NICOTINE 21 MG/24HR TD PT24: 21.0000 mg | MEDICATED_PATCH | TRANSDERMAL | 2 refills | Status: AC

## 2024-03-06 MED ORDER — HYDROXYZINE HCL 10 MG PO TABS: 10.0000 mg | ORAL_TABLET | Freq: Every day | ORAL | 1 refills | Status: AC

## 2024-03-06 NOTE — Progress Notes (Signed)
 Established Patient Office Visit  Subjective:  Patient ID: Paul Gregory, male    DOB: 03/08/1971  Age: 53 y.o. MRN: [id number]  CC:  Chief Complaint  Patient presents with   Medical Management of Chronic Issues   Discussed the use of a AI scribe software for clinical note transcription with the patient, who gave verbal consent to proceed.  HPI  Paul Gregory presents for routine follow-up.  He has history of kidney stone and elevated PSA.  He previously experienced UTI with hematuria due to kidney stone obstructing his urethra.  He completed antibiotic and was prescribed medication to dilate the urethra.  A cystoscopy was planned but was not completed.  He is concerned about the elevated PSA level specially with family history of cancer but has not scheduled further evaluation. He smoked 1-1/2 pack of cigarette per day and has been smoking from last 20 to 30 years. He does not take propranolol  for anxiety.  He reports no current pain, shortness of breath or chest pain.  HPI   Past Medical History:  Diagnosis Date   Alcohol use    Anxiety    Hematuria    Tobacco use     Past Surgical History:  Procedure Laterality Date   APPLICATION OF WOUND VAC Right 08/05/2019   Procedure: Application Of Wound Vac;  Surgeon: Kendal Franky SQUIBB, MD;  Location: [facility] OR;  Service: Orthopedics;  Laterality: Right;   I & D EXTREMITY Right 08/05/2019   Procedure: IRRIGATION AND DEBRIDEMENT RIGHT LEG;  Surgeon: Kendal Franky SQUIBB, MD;  Location: [facility] OR;  Service: Orthopedics;  Laterality: Right;   LAPAROTOMY N/A 08/04/2019   Procedure: EXPLORATORY LAPAROTOMY, splenectomy, small bowel resection;  Surgeon: Signe Mitzie LABOR, MD;  Location: [facility] OR;  Service: General;  Laterality: N/A;   SPLENECTOMY, TOTAL     TIBIA IM NAIL INSERTION Right 08/05/2019   Procedure: INTRAMEDULLARY (IM) NAIL TIBIAL;  Surgeon: Kendal Franky SQUIBB, MD;  Location: [facility] OR;  Service: Orthopedics;  Laterality: Right;    Family History   Problem Relation Age of Onset   Pancreatitis Brother    Pancreatitis Daughter     Social History   Socioeconomic History   Marital status: Divorced    Spouse name: Not on file   Number of children: Not on file   Years of education: Not on file   Highest education level: Not on file  Occupational History   Not on file  Tobacco Use   Smoking status: Every Day    Current packs/day: 0.50    Average packs/day: 0.5 packs/day for 30.0 years (15.0 ttl pk-yrs)    Types: Cigarettes   Smokeless tobacco: Never   Tobacco comments:    Smokes 1.5 pack daily.   Vaping Use   Vaping status: Never Used  Substance and Sexual Activity   Alcohol use: Yes    Alcohol/week: 7.0 standard drinks of alcohol    Types: 7 Cans of beer per week   Drug use: Not Currently    Types: Marijuana   Sexual activity: Yes  Other Topics Concern   Not on file  Social History Narrative   ** Merged History Encounter **       Social Drivers of Health   Financial Resource Strain: Low Risk  (04/08/2023)   Overall Financial Resource Strain (CARDIA)    Difficulty of Paying Living Expenses: Not hard at all  Food Insecurity: No Food Insecurity (04/08/2023)   Hunger Vital Sign    Worried About  Running Out of Food in the Last Year: Never true    Ran Out of Food in the Last Year: Never true  Transportation Needs: No Transportation Needs (04/08/2023)   PRAPARE - Administrator, Civil Service (Medical): No    Lack of Transportation (Non-Medical): No  Physical Activity: Inactive (04/08/2023)   Exercise Vital Sign    Days of Exercise per Week: 0 days    Minutes of Exercise per Session: 0 min  Stress: Stress Concern Present (04/08/2023)   [employer] of Occupational Health - Occupational Stress Questionnaire    Feeling of Stress : To some extent  Social Connections: Unknown (04/08/2023)   Social Connection and Isolation Panel    Frequency of Communication with Friends and Family: Patient unable to answer     Frequency of Social Gatherings with Friends and Family: Patient unable to answer    Attends Religious Services: 1 to 4 times per year    Active Member of [employer] or Organizations: No    Attends Banker Meetings: Never    Marital Status: Divorced  Catering manager Violence: Not At Risk (04/08/2023)   Humiliation, Afraid, Rape, and Kick questionnaire    Fear of Current or Ex-Partner: No    Emotionally Abused: No    Physically Abused: No    Sexually Abused: No     Outpatient Medications Prior to Visit  Medication Sig Dispense Refill   ibuprofen  (ADVIL ) 200 MG tablet Take 400 mg by mouth every 6 (six) hours as needed for moderate pain.     propranolol  (INDERAL ) 10 MG tablet TAKE 1 TABLET(10 MG) BY MOUTH THREE TIMES DAILY AS NEEDED 30 tablet 1   sulfamethoxazole -trimethoprim  (BACTRIM  DS) 800-160 MG tablet Take 1 tablet by mouth 2 (two) times daily. 20 tablet 0   tamsulosin  (FLOMAX ) 0.4 MG CAPS capsule Take 1 capsule (0.4 mg total) by mouth daily. 30 capsule 11   No facility-administered medications prior to visit.    No Known Allergies  ROS Review of Systems Negative unless indicated in HPI.    Objective:    Physical Exam Constitutional:      Appearance: Normal appearance.  HENT:     Mouth/Throat:     Mouth: Mucous membranes are moist.  Eyes:     Conjunctiva/sclera: Conjunctivae normal.     Pupils: Pupils are equal, round, and reactive to light.  Cardiovascular:     Rate and Rhythm: Normal rate and regular rhythm.     Pulses: Normal pulses.     Heart sounds: Normal heart sounds.  Pulmonary:     Effort: Pulmonary effort is normal.     Breath sounds: Normal breath sounds.  Abdominal:     General: Bowel sounds are normal.     Palpations: Abdomen is soft.  Musculoskeletal:     Cervical back: Normal range of motion. No tenderness.  Skin:    General: Skin is warm.     Findings: No bruising.  Neurological:     General: No focal deficit present.     Mental  Status: He is alert and oriented to person, place, and time. Mental status is at baseline.  Psychiatric:        Mood and Affect: Mood normal.        Behavior: Behavior normal.        Thought Content: Thought content normal.        Judgment: Judgment normal.     BP 122/80   Pulse 96   Temp 97.7  F (36.5 C)   Ht 5' 7 (1.702 m)   Wt 170 lb 3.2 oz (77.2 kg)   SpO2 99%   BMI 26.66 kg/m  Wt Readings from Last 3 Encounters:  03/06/24 170 lb 3.2 oz (77.2 kg)  11/04/23 165 lb (74.8 kg)  07/25/23 169 lb 3.2 oz (76.7 kg)     Health Maintenance  Topic Date Due   Hepatitis B Vaccines (1 of 3 - 19+ 3-dose series) Never done   Medicare Annual Wellness (AWV)  04/07/2024   Zoster Vaccines- Shingrix (1 of 2) 06/06/2024 (Originally 04/29/2021)   INFLUENZA VACCINE  03/20/2024   Pneumococcal Vaccine: 19-49 Years (3 of 3 - PCV20 or PCV21) 10/11/2024   Pneumococcal Vaccine: 50+ Years (3 of 3 - PCV20 or PCV21) 10/11/2024   Fecal DNA (Cologuard)  09/10/2025   DTaP/Tdap/Td (6 - Td or Tdap) 02/23/2028   HIV Screening  Completed   HPV VACCINES  Aged Out   Meningococcal B Vaccine  Aged Out   Lung Cancer Screening  Discontinued   COVID-19 Vaccine  Discontinued   Hepatitis C Screening  Discontinued       Topic Date Due   Hepatitis B Vaccines (1 of 3 - 19+ 3-dose series) Never done    Lab Results  Component Value Date   TSH 2.12 07/25/2023   Lab Results  Component Value Date   WBC 8.5 07/25/2023   HGB 16.8 07/25/2023   HCT 46.9 07/25/2023   MCV 104.6 (H) 07/25/2023   PLT 362.0 07/25/2023   Lab Results  Component Value Date   NA 139 07/25/2023   K 4.4 07/25/2023   CO2 28 07/25/2023   GLUCOSE 100 (H) 07/25/2023   BUN 10 07/25/2023   CREATININE 0.70 07/25/2023   BILITOT 0.8 07/25/2023   ALKPHOS 57 07/25/2023   AST 27 07/25/2023   ALT 26 07/25/2023   PROT 6.4 07/25/2023   ALBUMIN  4.3 07/25/2023   CALCIUM 9.4 07/25/2023   ANIONGAP 8 08/09/2019   EGFR 111 06/29/2022   GFR  106.14 07/25/2023   Lab Results  Component Value Date   CHOL 173 07/25/2023   Lab Results  Component Value Date   HDL 64.60 07/25/2023   Lab Results  Component Value Date   LDLCALC 100 (H) 07/25/2023   Lab Results  Component Value Date   TRIG 44.0 07/25/2023   Lab Results  Component Value Date   CHOLHDL 3 07/25/2023   Lab Results  Component Value Date   HGBA1C 5.1 01/10/2023      Assessment & Plan:  Anxiety Assessment & Plan: Propranolol  was prescribed for anxiety but patient never took it.  Discussed hydroxyzine  for anxiety and sleep. -Will start hydroxyzine  10 mg at night for anxiety and sleep. - Will continue to monitor.  Orders: -     hydrOXYzine  HCl; Take 1 tablet (10 mg total) by mouth at bedtime.  Dispense: 30 tablet; Refill: 1  Tobacco abuse counseling Assessment & Plan: Long-term history of smoking 1 and half pack/day.  Patient mentioned that smoking as a coping for anxiety and boredom.  Discussed physician benefits and nicotine  patches. -Prescribed nicotine  patches for cessation. -Encourage engagement in hobbies to reduce smoking. -Provide contact intake for additional salvation support.   Orders: -     Nicotine ; Place 1 patch (21 mg total) onto the skin daily.  Dispense: 30 patch; Refill: 2  Elevated PSA Assessment & Plan: Elevated PSA with family history of prostate cancer. His last visit was with urology  was on 10/01/2023 and the suggested prostate biopsy.  Patient is overdue. -Encourage patient to schedule urology appointment for further evaluation.     Follow-up: No follow-ups on file.   Kymberlyn Eckford, NP

## 2024-03-06 NOTE — Patient Instructions (Addendum)
 ELEVATED PSA: Your PSA levels are elevated, and you have a family history of prostate cancer. -Schedule an appointment with urology for further evaluation.  ANXIETY: You have increased anxiety, which may be linked to your smoking habits. You have not been taking propranolol  as prescribed. -Start taking hydroxyzine 10 mg at night for anxiety and sleep.   TOBACCO USE DISORDER: You have been smoking 1.5 packs per day for 25 to 30 years, using it as a coping mechanism for anxiety and boredom. -Start using nicotine patches to help you quit smoking. -Engage in hobbies to reduce smoking. -Contact additional cessation support if needed.  GENERAL HEALTH MAINTENANCE: We discussed lung cancer screening and shingles vaccination. A CT scan for lung cancer screening was ordered but not completed. -Schedule and complete the CT scan for lung cancer screening before December.

## 2024-03-19 DIAGNOSIS — R972 Elevated prostate specific antigen [PSA]: Secondary | ICD-10-CM | POA: Insufficient documentation

## 2024-03-19 NOTE — Assessment & Plan Note (Signed)
 Long-term history of smoking 1 and half pack/day.  Patient mentioned that smoking as a coping for anxiety and boredom.  Discussed physician benefits and nicotine  patches. -Prescribed nicotine  patches for cessation. -Encourage engagement in hobbies to reduce smoking. -Provide contact intake for additional salvation support.

## 2024-03-19 NOTE — Assessment & Plan Note (Signed)
 Elevated PSA with family history of prostate cancer. His last visit was with urology was on 10/01/2023 and the suggested prostate biopsy.  Patient is overdue. -Encourage patient to schedule urology appointment for further evaluation.

## 2024-03-19 NOTE — Assessment & Plan Note (Addendum)
 Propranolol  was prescribed for anxiety but patient never took it.  Discussed hydroxyzine  for anxiety and sleep. -Will start hydroxyzine  10 mg at night for anxiety and sleep. - Will continue to monitor.

## 2024-04-08 ENCOUNTER — Ambulatory Visit: Payer: Medicare Other

## 2024-08-25 ENCOUNTER — Ambulatory Visit

## 2024-09-09 ENCOUNTER — Ambulatory Visit: Admitting: Urology

## 2024-09-22 ENCOUNTER — Ambulatory Visit: Admitting: Urology

## 2024-10-28 ENCOUNTER — Ambulatory Visit
